# Patient Record
Sex: Male | Born: 1985 | Race: White | Hispanic: No | Marital: Single | Smoking: Current every day smoker
Health system: Southern US, Community
[De-identification: ages and names within clinical notes are randomized; demographics above are authoritative.]

## PROBLEM LIST (undated history)

## (undated) DIAGNOSIS — R768 Other specified abnormal immunological findings in serum: Secondary | ICD-10-CM

## (undated) DIAGNOSIS — F1199 Opioid use, unspecified with unspecified opioid-induced disorder: Secondary | ICD-10-CM

## (undated) HISTORY — DX: Opioid use, unspecified with unspecified opioid-induced disorder: F11.99

## (undated) HISTORY — DX: Other specified abnormal immunological findings in serum: R76.8

---
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
## 2007-03-01 ENCOUNTER — Emergency Department (HOSPITAL_COMMUNITY): Admission: EM | Admit: 2007-03-01 | Discharge: 2007-03-01 | Payer: Self-pay | Admitting: Emergency Medicine

## 2007-07-28 ENCOUNTER — Emergency Department (HOSPITAL_COMMUNITY): Admission: EM | Admit: 2007-07-28 | Discharge: 2007-07-28 | Payer: Self-pay | Admitting: Emergency Medicine

## 2007-08-05 ENCOUNTER — Emergency Department (HOSPITAL_COMMUNITY): Admission: EM | Admit: 2007-08-05 | Discharge: 2007-08-05 | Payer: Self-pay | Admitting: Emergency Medicine

## 2008-12-17 ENCOUNTER — Emergency Department (HOSPITAL_COMMUNITY): Admission: EM | Admit: 2008-12-17 | Discharge: 2008-12-17 | Payer: Self-pay | Admitting: Emergency Medicine

## 2009-02-24 ENCOUNTER — Emergency Department (HOSPITAL_COMMUNITY): Admission: EM | Admit: 2009-02-24 | Discharge: 2009-02-24 | Payer: Self-pay | Admitting: Emergency Medicine

## 2010-01-24 ENCOUNTER — Emergency Department (HOSPITAL_COMMUNITY)
Admission: EM | Admit: 2010-01-24 | Discharge: 2010-01-24 | Payer: Self-pay | Source: Home / Self Care | Admitting: Emergency Medicine

## 2010-01-27 ENCOUNTER — Emergency Department (HOSPITAL_COMMUNITY)
Admission: EM | Admit: 2010-01-27 | Discharge: 2010-01-28 | Payer: Self-pay | Source: Home / Self Care | Admitting: Emergency Medicine

## 2010-05-06 LAB — BASIC METABOLIC PANEL
BUN: 16 mg/dL (ref 6–23)
CO2: 24 mEq/L (ref 19–32)
Calcium: 8.7 mg/dL (ref 8.4–10.5)
Chloride: 103 mEq/L (ref 96–112)
Creatinine, Ser: 1.22 mg/dL (ref 0.4–1.5)
GFR calc Af Amer: >60 mL/min (ref >60)
GFR calc non Af Amer: >60 mL/min (ref >60)
Glucose, Bld: 129 mg/dL — ABNORMAL HIGH (ref 70–99)
Potassium: 3.1 mEq/L — ABNORMAL LOW (ref 3.5–5.1)
Sodium: 138 mEq/L (ref 135–145)

## 2010-05-06 LAB — CBC
HCT: 38.7 % — ABNORMAL LOW (ref 39.0–52.0)
Hemoglobin: 13.5 g/dL (ref 13.0–17.0)
MCHC: 34.9 g/dL (ref 30.0–36.0)
MCV: 92.2 fL (ref 78.0–100.0)
Platelets: 240 10*3/uL (ref 150–400)
RBC: 4.2 MIL/uL — ABNORMAL LOW (ref 4.22–5.81)
RDW: 12.7 % (ref 11.5–15.5)
WBC: 11.8 10*3/uL — ABNORMAL HIGH (ref 4.0–10.5)

## 2010-05-06 LAB — PROTIME-INR
INR: 0.9 (ref 0.00–1.49)
Prothrombin Time: 12.1 seconds (ref 11.6–15.2)

## 2010-06-21 ENCOUNTER — Emergency Department (HOSPITAL_COMMUNITY): Payer: Self-pay

## 2010-06-21 ENCOUNTER — Emergency Department (HOSPITAL_COMMUNITY)
Admission: EM | Admit: 2010-06-21 | Discharge: 2010-06-21 | Disposition: A | Discharge status: Home / Self Care | Payer: Self-pay | Attending: Emergency Medicine | Admitting: Emergency Medicine

## 2010-06-21 DIAGNOSIS — S0083XA Contusion of other part of head, initial encounter: Secondary | ICD-10-CM | POA: Insufficient documentation

## 2010-06-21 DIAGNOSIS — R0989 Other specified symptoms and signs involving the circulatory and respiratory systems: Secondary | ICD-10-CM | POA: Insufficient documentation

## 2010-06-21 DIAGNOSIS — H1130 Conjunctival hemorrhage, unspecified eye: Secondary | ICD-10-CM | POA: Insufficient documentation

## 2010-06-21 DIAGNOSIS — R0609 Other forms of dyspnea: Secondary | ICD-10-CM | POA: Insufficient documentation

## 2010-06-21 DIAGNOSIS — IMO0002 Reserved for concepts with insufficient information to code with codable children: Secondary | ICD-10-CM | POA: Insufficient documentation

## 2010-06-21 DIAGNOSIS — S0003XA Contusion of scalp, initial encounter: Secondary | ICD-10-CM | POA: Insufficient documentation

## 2010-06-21 DIAGNOSIS — S0510XA Contusion of eyeball and orbital tissues, unspecified eye, initial encounter: Secondary | ICD-10-CM | POA: Insufficient documentation

## 2010-06-21 DIAGNOSIS — R0789 Other chest pain: Secondary | ICD-10-CM | POA: Insufficient documentation

## 2010-06-21 DIAGNOSIS — S298XXA Other specified injuries of thorax, initial encounter: Secondary | ICD-10-CM | POA: Insufficient documentation

## 2010-06-21 LAB — URINALYSIS, ROUTINE W REFLEX MICROSCOPIC
Bilirubin Urine: NEGATIVE
Glucose, UA: NEGATIVE mg/dL
Hgb urine dipstick: NEGATIVE
Ketones, ur: NEGATIVE mg/dL
Nitrite: NEGATIVE
Protein, ur: NEGATIVE mg/dL
Specific Gravity, Urine: 1.019 (ref 1.005–1.030)
Urobilinogen, UA: 0.2 mg/dL (ref 0.0–1.0)
pH: 6 (ref 5.0–8.0)
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
## 2010-07-10 ENCOUNTER — Emergency Department (HOSPITAL_COMMUNITY)
Admission: EM | Admit: 2010-07-10 | Discharge: 2010-07-10 | Disposition: A | Discharge status: Home / Self Care | Payer: Self-pay | Attending: Emergency Medicine | Admitting: Emergency Medicine

## 2010-07-10 ENCOUNTER — Emergency Department (HOSPITAL_COMMUNITY): Payer: Self-pay

## 2010-07-10 DIAGNOSIS — F29 Unspecified psychosis not due to a substance or known physiological condition: Secondary | ICD-10-CM | POA: Insufficient documentation

## 2010-07-10 DIAGNOSIS — R569 Unspecified convulsions: Secondary | ICD-10-CM | POA: Insufficient documentation

## 2010-07-10 LAB — COMPREHENSIVE METABOLIC PANEL
ALT: 24 U/L (ref 0–53)
AST: 32 U/L (ref 0–37)
Albumin: 4.8 g/dL (ref 3.5–5.2)
Alkaline Phosphatase: 91 U/L (ref 39–117)
BUN: 14 mg/dL (ref 6–23)
GFR calc Af Amer: >60 mL/min (ref >60)
GFR calc non Af Amer: >60 mL/min (ref >60)
Glucose, Bld: 132 mg/dL — ABNORMAL HIGH (ref 70–99)
Potassium: 3.8 mEq/L (ref 3.5–5.1)
Sodium: 136 mEq/L (ref 135–145)
Total Bilirubin: 0.3 mg/dL (ref 0.3–1.2)
Total Protein: 7.5 g/dL (ref 6.0–8.3)

## 2010-07-10 LAB — RAPID URINE DRUG SCREEN, HOSP PERFORMED
Amphetamines: NOT DETECTED
Barbiturates: NOT DETECTED
Benzodiazepines: POSITIVE — AB
Cocaine: NOT DETECTED

## 2010-07-10 LAB — URINE MICROSCOPIC-ADD ON

## 2010-07-10 LAB — CBC
HCT: 44.8 % (ref 39.0–52.0)
Hemoglobin: 14.9 g/dL (ref 13.0–17.0)
MCH: 30.4 pg (ref 26.0–34.0)
MCHC: 33.3 g/dL (ref 30.0–36.0)
MCV: 91.4 fL (ref 78.0–100.0)
WBC: 9.6 10*3/uL (ref 4.0–10.5)

## 2010-07-10 LAB — URINALYSIS, ROUTINE W REFLEX MICROSCOPIC
Bilirubin Urine: NEGATIVE
Glucose, UA: NEGATIVE mg/dL
Ketones, ur: NEGATIVE mg/dL
Leukocytes, UA: NEGATIVE
Protein, ur: 100 mg/dL — AB
Specific Gravity, Urine: 1.024 (ref 1.005–1.030)
Urobilinogen, UA: 0.2 mg/dL (ref 0.0–1.0)
pH: 5.5 (ref 5.0–8.0)

## 2010-07-10 LAB — DIFFERENTIAL
Basophils Relative: 0 % (ref 0–1)
Eosinophils Absolute: 0.2 10*3/uL (ref 0.0–0.7)
Lymphocytes Relative: 35 % (ref 12–46)
Monocytes Absolute: 0.4 10*3/uL (ref 0.1–1.0)
Monocytes Relative: 4 % (ref 3–12)
Neutro Abs: 5.6 10*3/uL (ref 1.7–7.7)
Neutrophils Relative %: 59 % (ref 43–77)

## 2010-07-10 LAB — ETHANOL: Alcohol, Ethyl (B): <11 mg/dL — ABNORMAL HIGH (ref 0–10)

## 2010-08-07 ENCOUNTER — Inpatient Hospital Stay (HOSPITAL_COMMUNITY)
Admission: EM | Admit: 2010-08-07 | Discharge: 2010-08-08 | DRG: 572 | Disposition: A | Discharge status: Home / Self Care | Payer: Self-pay | Attending: Orthopedic Surgery | Admitting: Orthopedic Surgery

## 2010-08-07 ENCOUNTER — Emergency Department (HOSPITAL_COMMUNITY): Payer: Self-pay

## 2010-08-07 DIAGNOSIS — Y93K9 Activity, other involving animal care: Secondary | ICD-10-CM

## 2010-08-07 DIAGNOSIS — Y998 Other external cause status: Secondary | ICD-10-CM

## 2010-08-07 DIAGNOSIS — W540XXA Bitten by dog, initial encounter: Secondary | ICD-10-CM | POA: Diagnosis present

## 2010-08-07 DIAGNOSIS — Y92009 Unspecified place in unspecified non-institutional (private) residence as the place of occurrence of the external cause: Secondary | ICD-10-CM

## 2010-08-07 DIAGNOSIS — F1210 Cannabis abuse, uncomplicated: Secondary | ICD-10-CM | POA: Diagnosis present

## 2010-08-07 DIAGNOSIS — S61509A Unspecified open wound of unspecified wrist, initial encounter: Principal | ICD-10-CM | POA: Diagnosis present

## 2010-08-07 DIAGNOSIS — F411 Generalized anxiety disorder: Secondary | ICD-10-CM | POA: Diagnosis present

## 2010-08-07 LAB — BASIC METABOLIC PANEL
BUN: 8 mg/dL (ref 6–23)
CO2: 24 mEq/L (ref 19–32)
Chloride: 108 mEq/L (ref 96–112)
Creatinine, Ser: 0.73 mg/dL (ref 0.50–1.35)
GFR calc Af Amer: >60 mL/min (ref >60)
GFR calc non Af Amer: >60 mL/min (ref >60)
Glucose, Bld: 103 mg/dL — ABNORMAL HIGH (ref 70–99)
Potassium: 3.7 mEq/L (ref 3.5–5.1)
Sodium: 141 mEq/L (ref 135–145)

## 2010-08-07 LAB — CBC
HCT: 39.5 % (ref 39.0–52.0)
Hemoglobin: 13.2 g/dL (ref 13.0–17.0)
MCHC: 33.4 g/dL (ref 30.0–36.0)
MCV: 91.4 fL (ref 78.0–100.0)
Platelets: 247 10*3/uL (ref 150–400)
RBC: 4.32 MIL/uL (ref 4.22–5.81)
RDW: 12.2 % (ref 11.5–15.5)
WBC: 16 10*3/uL — ABNORMAL HIGH (ref 4.0–10.5)

## 2010-08-16 NOTE — H&P (Signed)
Benjamin Hansen, Benjamin Hansen            ACCOUNT NO.:  [id number]  MEDICAL RECORD NO.:  [id number]  LOCATION:  1612                         FACILITY:  [facility]  PHYSICIAN:  Benjamin Hansen, M.D.DATE OF BIRTH:  31-Dec-1985  DATE OF ADMISSION:  08/07/2010 DATE OF DISCHARGE:                             HISTORY & PHYSICAL   History and Physical/Procedure Note  CHIEF COMPLAINT:  "My brother's dog bit me."  HISTORY OF THE PRESENT ILLNESS:  Mr.  Hansen is a pleasant 25 year old gentleman, who is right-hand dominant, presents to [facility] Emergency Room for evaluation of his right upper extremity predicament after sustaining multiple dog bite injuries to the right wrist volar and dorsally.  The patient states that his brothers dog and his dog came and involved in an fight over food earlier this morning.  He tried to separate them when his brother's dog bit the right wrist volar and dorsal regions.  He states that the dog was domesticated and that rabies vaccinations were up-to-date.  In addition, he states his tetanus shot has been given within the last 5 years.  He received 1 g of Ancef IM per the emergency room staff on visit.  He has been n.p.o. since last night. His injury happened approximately 6 a.m. this morning.  Currently, he is fairly comfortable in regards to the upper extremity.  He denies any gross numbness or tingling of thumb, index, middle, or ring and small fingers.  He denies any other injury to the left hand or other portions of his body.  PAST MEDICAL HISTORY:  Negative for chronic medical issues.  Anxiety and a prior history of a seizure disorder apparently with no treatment to date.  CURRENT MEDICATIONS:  None.  DRUG ALLERGIES:  None.  PAST SURGICAL HISTORY: 1. He has had a complex laceration repair/reattachment of his left     ear. 2. In addition, he has a left index finger distal tip revision     amputation from a prior dog bite 1-2 years ago.  FAMILY  MEDICAL HISTORY:  Significant for COPD, multiple sclerosis in his aunt.  In addition, his grandparents had a history of "emphysema."  SOCIAL HISTORY:  He is single.  He has no children.  His denies tobacco use.  He will rarely enjoy an alcoholic beverage.  Cannabis abuse.  REVIEW OF SYSTEMS:  Negative for any fevers, chills, malaise, visual disturbances, visual changes, headache, shortness of breath, cough, dyspnea, nausea, vomiting, abdominal pain, difficulties with bowel or bladder functioning, weakness of the extremities.  PHYSICAL EXAMINATION:  VITAL SIGNS:  Blood pressure is 127/72, pulse 88, respirations 18, temperature is 97.8, O2 sat is 97%. GENERAL:  He is pleasant, no acute distress, appearing his stated age in weight and height, accompanied with his mother. HEAD AND FACE:  Show that he has had a complex revision/reattachment procedure to the left ear, otherwise he is atraumatic, normocephalic. EYES:  Pupils are equal, round and reactive to light and accommodation per emergency room records. NECK:  Supple without masses. RESPIRATORY:  He has normal respiratory effort and excursion. CHEST:  Nontender. ABDOMEN:  Soft, nontender.  No guarding is present. EXTREMITIES:  Evaluation of the extremities shows that left upper extremity  has normal alignment, strength, and stability.  He has a small abrasion about the dorsal aspect of the left hand.  This is very superficial and does not penetrate deeply.  Evaluation of the right upper extremity shows that he has 3 linear lacerations about the volar wrist region beginning at the distal wrist crease.  These are approximately 3 cm or more in nature.  There is obvious exposed subcutaneous tissue present about each laceration.  There is obvious pre- necrotic tissue and nonviable tissue apparent.  In addition, he has three smaller lacerations about the dorsal ulnar aspect of his wrist overlying the ulnar styloid and slightly proximal.   These are deep in nature with subcutaneous tissue exposed.  I should note FPL and EPL functioning are intact.  FDS and FDP functioning intact about the index, middle, ring and small finger.  Extensor mechanism is intact and nontender.  He is intact both with flexion and extension with resistance.  Median nerve distribution intact.  His APB fires nicely. Deep branch of the ulnar nerve is intact.  Intrinsics fire nicely. First dorsal interosseus is intact.  Wrist flexion and extension are intact and he has excellent refill noted about each digit.  DIAGNOSTIC STUDIES:  Radiographs show no acute bony abnormalities, space- occupying lesions, soft air in the tissues were present.  ASSESSMENT: 1. Multiple dog bite injuries to the right wrist both dorsal and     volar, grossly neurovascularly intact. 2. Questionable history of a seizure disorder without current     treatment to date.  PLAN:  We discussed with Benjamin Hansen, the nature of his upper extremity predicament and our recommendation is to proceed with a preliminary incision and drainage and exploration of his wounds in the ER setting.  Thus, after obtaining verbal consent, the patient underwent implementation of a local field block about the dorsal and volar wounds using three 10 cc syringes filled with 5 cc of Marcaine without epinephrine and 5 cc of Xylocaine.  A local field block was instituted to the volar wrist region and the dorsal wrist region.  Once appropriate anesthetic was obtained, the patient was then sterilely prepped and draped at which time I should note 5 L of saline were then used to irrigate the wounds sequentially.  Once this was performed, exploration of the volar wounds revealed penetration to the subcutaneous tissue. The palmaris longus was identified; however, the sheath was intact and there did not appear to be penetration into the sheath.  Volar ulnar aspect of the wound was explored.  There did not appear  to be obvious laceration or involvement of the tendon sheath present.  Ulnar nerve was intact clinically, but not identified via exploration.  The dorsal wounds were then explored showing no obvious tendon involvement at that juncture.  I should note these wounds were deep in nature involving the skin, subcutaneous tissue, and portions of the flexor and extensor muscles.  Once exploration was performed, copious irrigation was re- implemented with excisional debridement of necrotic and pre- necrotic/nonviable skin edges and subcutaneous tissue.  Once appropriate incision and drainage was performed to the wounds, Penrose drains were placed into the wounds.  Following this, a loose closure with 1 chromic suture was placed about the most distal wound and the second most distal wound about the volar wrist.  Remaining wounds were left open to drain with packing and/or Penrose drain implementation.  Neosporin/Triple Antibiotic was then applied to the hand and arm as he had some small abrasions about  the digits without deep penetration.  He was then placed in bulky soft dressings followed by wrist splint.  We will plan to admit him for close observation, IV antibiotics to include Unasyn 3 g IV q.6 h.  We will keep a close watch on him over the next 24 hours.  We are going to keep him n.p.o. at this juncture and reevaluate him later this afternoon.  If he all looks well at that juncture, we will consider implementing a regular diet.  If he has any complicating features later this afternoon, formal exploration in the operative suite may be entertained.  All questions were encouraged and answered.     Karie Chimera, P.A.-C.   ______________________________ Benjamin Ano. Amanda Pea, M.D.    BB/MEDQ  D:  08/07/2010  T:  08/07/2010  Job:  [id number]  Electronically Signed by Karie Chimera P.A.-C. on 08/09/2010 07:19:09 AM Electronically Signed by Dominica Severin M.D. on 08/16/2010 07:25:53 PM

## 2010-10-08 NOTE — Discharge Summary (Signed)
Benjamin Hansen, Benjamin Hansen            ACCOUNT NO.:  [id number]  MEDICAL RECORD NO.:  [id number]  LOCATION:  1612                         FACILITY:  [facility]  PHYSICIAN:  Dionne Ano. Viola Placeres, M.D.DATE OF BIRTH:  December 10, 1985  DATE OF ADMISSION:  08/07/2010 DATE OF DISCHARGE:  08/08/2010                              DISCHARGE SUMMARY   ADMITTING DIAGNOSES: 1. Multiple dog bite injury to the right wrist to include the dorsal     and volar aspect. 2. Questionable history of seizure disorder without current treatment     today.  DISCHARGE DIAGNOSES: 1. Multiple dog bite injury to the right wrist to include the dorsal     and volar aspect, improved. 2. Questionable history of seizure disorder without current treatment     today.  SURGEON:  Dionne Ano. Amanda Pea, MD  CONSULTS:  None.  PROCEDURE:  Irrigation and excisional debridement of the right wrist dorsal and volar aspects without gross neurovascular compromise with mild contusive injury to the median nerve.  BRIEF HISTORY OF PRESENT ILLNESS:  Benjamin Hansen is a pleasant 25 year old gentleman who presented to [facility] Room for evaluation of his upper extremity after sustaining multiple dog bites to the right wrist and forearm region.  He was noted to have multiple lacerations about the dorsal and volar forearm.  He underwent I and D in the emergency room setting under local field block and was admitted for IV antibiotics and close observation to ensure he did not develop an infectious process.  He tolerated the procedure in the emergency room setting without difficulties and was admitted for IV antibiotics, pain medication, and close observation.  His WBC at time of admit was noted be 16, H and H was 13.2 and 39.5.  BMET was within normal limits.  His vital signs were stable.  He was afebrile.  HOSPITAL COURSE:  Mr. Benjamin Hansen was admitted on July 19th after undergoing I and D in the emergency room setting secondary to  multiple dog bites.  He was placed on Unasyn for antibiotic prophylaxis. Diligent wound care was implemented at the time of his admit with daily wound care to consist wet-to-dry dressings and whirlpool.  He tolerated this very well.  He remained stable without increased amount of difficulty.  His pain was controlled.  He denied any fevers, chills, or constitutional symptoms.  On the following day, he had mild numbness about the thumb consistent with median nerve contusive injury but was grossly neurovascular intact with excellent digital range of motion. His wounds after 24 hours admit showed no increased signs of infectious process or cellulitis.  No purulent drainage was present.  His vital signs were stable.  He was afebrile.  Decision made to discharge him home to care of his family on August 08, 2010.  Head was atraumatic. Lungs were clear to auscultation.  Heart is regular rate and rhythm. Abdomen was nontender.  Bowel sounds were positive.  He was neurovascularly intact.  Decision was made to discharge him home with close follow up in our office as well as antibiotics daily.  FINAL DIAGNOSES: 1. Status post incision and drainage multiple dog bite wounds to the     right upper  extremity. 2. History of questionable seizure disorder with no treatment to date     and no obvious seizure activity noted while inpatient.  PLAN:  We are going to discharge him home to the care of his family.  He will be on a regular diet.  We discussed with him elevation, diligent finger range of motion.  We will have return to see [place] in our office tomorrow at 7:00 a.m. for wound check.  We will once again reiterate to him wet-to-dry dressing changes and wound care.  DISCHARGE MEDICATIONS: 1. Augmentin 875 one p.o. b.i.d. for 14 days. 2. Percocet 1-2 p.o. q.4-6 h p.r.n. pain. 3. Vitamin C 1000 mg. 4. Peri-Colace to prevent constipation.     Karie Chimera,  P.A.-C.   ______________________________ Dionne Ano. Amanda Pea, M.D.    BB/MEDQ  D:  09/07/2010  T:  09/08/2010  Job:  [id number]  Electronically Signed by Karie Chimera P.A.-C. on 09/10/2010 10:59:53 AM Electronically Signed by Dominica Severin M.D. on 10/08/2010 06:27:20 AM

## 2010-11-15 LAB — CBC
HCT: 42.4
Platelets: 276
RDW: 12.4
WBC: 7.4

## 2010-11-15 LAB — POCT I-STAT, CHEM 8
BUN: 11
Calcium, Ion: 1.14
Chloride: 106
HCT: 45
Sodium: 140

## 2014-09-18 ENCOUNTER — Emergency Department (HOSPITAL_COMMUNITY)
Admission: EM | Admit: 2014-09-18 | Discharge: 2014-09-19 | Disposition: A | Discharge status: Home / Self Care | Payer: Self-pay | Attending: Emergency Medicine | Admitting: Emergency Medicine

## 2014-09-18 ENCOUNTER — Encounter (HOSPITAL_COMMUNITY): Payer: Self-pay | Admitting: Emergency Medicine

## 2014-09-18 DIAGNOSIS — Y999 Unspecified external cause status: Secondary | ICD-10-CM | POA: Insufficient documentation

## 2014-09-18 DIAGNOSIS — Y939 Activity, unspecified: Secondary | ICD-10-CM | POA: Insufficient documentation

## 2014-09-18 DIAGNOSIS — T40601A Poisoning by unspecified narcotics, accidental (unintentional), initial encounter: Secondary | ICD-10-CM | POA: Insufficient documentation

## 2014-09-18 DIAGNOSIS — Z720 Tobacco use: Secondary | ICD-10-CM | POA: Insufficient documentation

## 2014-09-18 DIAGNOSIS — Y929 Unspecified place or not applicable: Secondary | ICD-10-CM | POA: Insufficient documentation

## 2014-09-18 NOTE — ED Notes (Signed)
Pt states he used tonight for the first time in 4 months.  Family states they found his cyanotic and agonal breathing.  Family states that they did try CPR.  Pt was A&O at arrival of EMS.

## 2014-09-18 NOTE — ED Provider Notes (Signed)
CSN: [id number]     Arrival date & time 09/18/14  2247 History   First MD Initiated Contact with Patient 09/18/14 2318     Chief Complaint  Patient presents with  . Drug Overdose     (Consider location/radiation/quality/duration/timing/severity/associated sxs/prior Treatment) Patient is a 29 y.o. male presenting with Overdose. The history is provided by the patient.  Drug Overdose  He injected heroin at about 10 PM and apparently collapsed. Family is reported to a found him cyanotic and with agonal breathing and dad did start CPR. EMS reports that he was awake and alert on their arrival. He states that he has a history of narcotic abuse but had been clean for several years until about 3 months ago. He denies using any other drugs.  History reviewed. No pertinent past medical history. History reviewed. No pertinent past surgical history. History reviewed. No pertinent family history. History  Substance Use Topics  . Smoking status: Current Every Day Smoker -- 0.50 packs/day    Types: Cigarettes  . Smokeless tobacco: Not on file  . Alcohol Use: No    Review of Systems  All other systems reviewed and are negative.     Allergies  Review of patient's allergies indicates no known allergies.  Home Medications   Prior to Admission medications   Medication Sig Start Date End Date Taking? Authorizing Provider  naproxen sodium (ANAPROX) 220 MG tablet Take 220 mg by mouth 2 (two) times daily as needed (pain).   Yes Historical Provider, MD   BP 125/83 mmHg  Pulse 112  Temp(Src) 98.4 F (36.9 C) (Oral)  Resp 12  SpO2 94% Physical Exam  Nursing note and vitals reviewed.  29 year old male, resting comfortably and in no acute distress. Vital signs are significant for tachycardia. Oxygen saturation is 94%, which is normal. Head is normocephalic and atraumatic. PERRLA, EOMI. Oropharynx is clear. Neck is nontender and supple without adenopathy or JVD. Back is nontender and there is  no CVA tenderness. Lungs are clear without rales, wheezes, or rhonchi. Chest is nontender. Heart has regular rate and rhythm without murmur. Abdomen is soft, flat, nontender without masses or hepatosplenomegaly and peristalsis is normoactive. Extremities have no cyanosis or edema, full range of motion is present. Recent injection site noted in right antecubital space. Skin is warm and dry without rash. Neurologic: Mental status is normal, cranial nerves are intact, there are no motor or sensory deficits.  ED Course  Procedures (including critical care time)  MDM   Final diagnoses:  Opiate overdose, accidental or unintentional, initial encounter    Narcotic overdose-probably accommodation of heroin and fentanyl. He is awake and alert currently. No indication for any lab testing. He will be observed in the ED until 4 hours after his injection.  He continued to be awake, alert, with no signs of respiratory depression. He is discharged with a prescription for naloxone autoinjector. Resource guide given some that he can connect with appropriate drug counseling programs.  Dione Booze, MD 09/19/14 0130

## 2014-09-18 NOTE — ED Notes (Signed)
Bed: WA04 Expected date: 09/18/14 Expected time: 10:31 PM Means of arrival: Ambulance Comments: Heroin overdose

## 2014-09-18 NOTE — ED Notes (Signed)
Report given to Travia, RN 

## 2014-09-18 NOTE — ED Notes (Signed)
Dr. Glick at bedside.  

## 2014-09-19 MED ORDER — NALOXONE HCL 0.4 MG/ML IJ SOLN: 0.4000 mg | INTRAMUSCULAR | Status: DC | PRN

## 2014-09-19 NOTE — Discharge Instructions (Signed)
Do not inject any narcotics - ever!   Narcotic Overdose A narcotic overdose is the misuse or overuse of a narcotic drug. A narcotic overdose can make you pass out and stop breathing. If you are not treated right away, this can cause permanent brain damage or stop your heart. Medicine may be given to reverse the effects of an overdose. If so, this medicine may bring on withdrawal symptoms. The symptoms may be abdominal cramps, throwing up (vomiting), sweating, chills, and nervousness. Injecting narcotics can cause more problems than just an overdose. AIDS, hepatitis, and other very serious infections are transmitted by sharing needles and syringes. If you decide to quit using, there are medicines which can help you through the withdrawal period. Trying to quit all at once on your own can be uncomfortable, but not life-threatening. Call your caregiver, Narcotics Anonymous, or any drug and alcohol treatment program for further help.  Document Released: 03/14/2004 Document Revised: 04/29/2011 Document Reviewed: 01/06/2009 [facility] Patient Information 2015 [place], [place]. This information is not intended to replace advice given to you by your health care provider. Make sure you discuss any questions you have with your health care provider.  Naloxone injection What is this medicine? NALOXONE (nal OX one) is a narcotic blocker. It is used to treat narcotic drug overdose. This medicine may be used for other purposes; ask your health care provider or pharmacist if you have questions. COMMON BRAND NAME(S): EVZIO, Narcan What should I tell my health care provider before I take this medicine? They need to know if you have any of these conditions: -drug abuse or addiction -heart disease -an unusual or allergic reaction to naloxone, other medicines, foods, dyes, or preservatives -pregnant or trying to get pregnant -breast-feeding How should I use this medicine? This medicine is for injection into the outer  thigh. It can be injected through clothing if needed. Get emergency medical help right away after giving the first dose of this medicine, even if the person wakes up. You should be familiar with how to recognize the signs and symptoms of an opioid overdose. Administer according to the printed instructions on the device label or the electronic voice instructions. You should practice using the Trainer injector before this medicine is needed. Talk to your pediatrician regarding the use of this medicine in children. While this drug may be prescribed for children as young as newborn for selected conditions, precautions do apply. For infants less than 1 year of age, pinch the thigh muscle while administering. Overdosage: If you think you have taken too much of this medicine contact a poison control center or emergency room at once. NOTE: This medicine is only for you. Do not share this medicine with others. What if I miss a dose? This does not apply. What may interact with this medicine? This medicine is only used during an emergency. No interactions are expected during emergency use. This list may not describe all possible interactions. Give your health care provider a list of all the medicines, herbs, non-prescription drugs, or dietary supplements you use. Also tell them if you smoke, drink alcohol, or use illegal drugs. Some items may interact with your medicine. What should I watch for while using this medicine? Keep this medicine ready for use in the case of an opioid overdose. Make sure that you have the phone number of your doctor or health care professional and local hospital ready. You may need to have additional doses of this medicine. Each injector contains a single dose. Some emergencies  may require additional doses. After use, bring the treated person to the nearest hospital or call 911. Make sure the treating health care professional knows that the person has received an injection of this  medicine. You will receive additional instructions on what to do during and after use of this medicine before an emergency occurs. What side effects may I notice from receiving this medicine? Side effects that you should report to your doctor or health care professional as soon as possible: -allergic reactions like skin rash, itching or hives, swelling of the face, lips, or tongue -breathing problems -fast, irregular heartbeat -high blood pressure -pain that was controlled by narcotic pain medicine -seizures -stomach cramps Side effects that usually do not require medical attention (report to your doctor or health care professional if they continue or are bothersome): -aches and pains -diarrhea -fever or chills -irritable, nervous, restless -nausea, vomiting -runny nose -sweating -trembling -weak This list may not describe all possible side effects. Call your doctor for medical advice about side effects. You may report side effects to FDA at 1-800-FDA-1088. Where should I keep my medicine? Keep out of the reach of children. Store at room temperature between 15 and 25 degrees C (59 and 77 degrees F). Keep this medicine in its outer case until ready to use. Occasionally check the solution through the viewing window of the injector. The solution should be clear. If it is discolored, cloudy, or contains solid particles, replace it with a new injector. Remember to check the expiration date of this medicine regularly. Throw away any unused medicine after the expiration date. NOTE: This sheet is a summary. It may not cover all possible information. If you have questions about this medicine, talk to your doctor, pharmacist, or health care provider.  2015, Elsevier/Gold Standard. (2012-06-03 13:44:54)   Emergency Department Resource Guide 1) Find a Doctor and Pay Out of Pocket Although you won't have to find out who is covered by your insurance plan, it is a good idea to ask around and get  recommendations. You will then need to call the office and see if the doctor you have chosen will accept you as a new patient and what types of options they offer for patients who are self-pay. Some doctors offer discounts or will set up payment plans for their patients who do not have insurance, but you will need to ask so you aren't surprised when you get to your appointment.  2) Contact Your Local Health Department Not all health departments have doctors that can see patients for sick visits, but many do, so it is worth a call to see if yours does. If you don't know where your local health department is, you can check in your phone book. The CDC also has a tool to help you locate your state's health department, and many state websites also have listings of all of their local health departments.  3) Find a Walk-in Clinic If your illness is not likely to be very severe or complicated, you may want to try a walk in clinic. These are popping up all over the country in pharmacies, drugstores, and shopping centers. They're usually staffed by nurse practitioners or physician assistants that have been trained to treat common illnesses and complaints. They're usually fairly quick and inexpensive. However, if you have serious medical issues or chronic medical problems, these are probably not your best option.  No Primary Care Doctor: - Call Health Connect at  [phone] - they can help you locate a  primary care doctor that  accepts your insurance, provides certain services, etc. - Physician Referral Service- [phone]  Chronic Pain Problems: Organization         Address  Phone   Notes  [facility]  [phone] Patients need to be referred by their primary care doctor.   Medication Assistance: Organization         Address  Phone   Notes  [facility] Medication [facility] [address] [place] [place], [place] [address] [phone] --Must be a resident of  [facility] -- Must have NO insurance coverage whatsoever (no Medicaid/ Medicare, etc.) -- The pt. MUST have a primary care doctor that directs their care regularly and follows them in the community   [facility]ist  [phone]   [employer]  [phone]    Agencies that provide inexpensive medical care: Organization         Address  Phone   Notes  [facility]  [phone]   [facility]    [phone]   [facility] [address] [place], [place] [address] [phone]   [facility] [address] [place] [place] [phone]   Planned Parenthood    [phone]   [facility]    [phone]   [facility]  [address], [place] Phone:  [phone], Fax:  [phone] Hours of Operation:  9 am - 6 pm, M-F.  Also accepts Medicaid/Medicare and self-pay.  [facility]  [address], [place] Phone: [phone], Fax: [phone]. Hours of Operation:  8:30 am - 5:30 pm, M-F.  Also accepts Medicaid and self-pay.  [facility] [place] [address], [place] Phone: [phone]   [facility] [address] Natasha Bence [place], [place] [phone], Ext. [phone] Mondays & Thursdays: 7-9 AM.  First 15 patients are seen on a first come, first serve basis.    Medicaid-accepting [facility] Providers:  Organization         Address  Phone   Notes  [facility] [address], [place] [phone] Also accepts self-pay patients.  [facility] [address] Laurell Josephs [place], [place]  [phone]   [facility] [address], [place] [phone]   [facility] [address] [place] [phone]   Renaye Rakers [address], [place]   [phone] Only accepts [place] Access  [place] patients after they have their name applied to their card.   Self-Pay (no insurance) in [facility]:  Organization         Address  Phone   Notes  Sickle Cell Patients, [facility] [address] [place], [place] [phone]   [facility] [address] [place], [place] [phone]   [facility] [place]  [address], [place] [phone]   [facility]/Dr. Osei-Bonsu  [address], [place] or [address], [place] [phone] Phone number for both [place] and [place] locations is the same.  [facility] [address] [place] [phone]   [facility] [address] [place] [address] [place] or [address] [phone]  [phone]   [facility] [address] [place] [phone], phone; [phone], fax Sees patients 1st and 3rd Saturday of every month.  Must not qualify for public or private insurance (i.e. Medicaid, Medicare, [place] Health Choice, Veterans' Benefits)  Household income should be no more than 200% of the poverty level The clinic cannot treat you if you are pregnant or think you are pregnant  Sexually transmitted diseases are not treated at the clinic.    Dental Care: Organization         Address  Phone  Notes  [facility] Department of [facility] [address] [place], [place] [phone] Accepts children up to age 38 who are enrolled in [place] or [place] Health Choice; pregnant women with a Medicaid card; and children who have applied for Medicaid or [place] Health Choice, but were declined, whose parents can pay a reduced fee at time of service.  [facility] of [facility]  [address], [place] [phone] Accepts children up to age 83 who are enrolled in [place] or [place] Health Choice; pregnant women with a Medicaid  card; and children who have applied for Medicaid or [place] Health Choice, but were declined, whose parents can pay a reduced fee at time of service.  [facility]  [address] [place], [place] [phone] Patients are seen by appointment only. Walk-ins are not accepted. [facility] will see patients 98 years of age and older. Monday - Tuesday (8am-5pm) Most Wednesdays (8:30-5pm) $30 per visit, cash only  [facility] Adult [facility] Access PRO[facility]  [address], [facility] [phone] Patients are seen by appointment only. Walk-ins are not accepted. [facility] will see patients 64 years of age and older. One Wednesday Evening (Monthly: Volunteer Based).  $30 per visit, cash only  [employer]  [phone] for adults; Children under age 62, call Graduate Pediatric Dentistry at [phone]. Children aged 51-14, please call [phone] to request a pediatric application.  Dental services are provided in all areas of dental care including fillings, crowns and bridges, complete and partial dentures, implants, gum treatment, root canals, and extractions. Preventive care is also provided. Treatment is provided to both adults and children. Patients are selected via a lottery and there is often a waiting list.   [facility] [address] [place]  [phone] [email or web address]   [facility] [address] [place], [place] [phone], Ext. [phone] Second and Fourth Thursday of each month, opens at 6:30 AM; Clinic ends at 9 AM.  Patients are seen on a first-come first-served basis, and a limited number are seen during each clinic.   [facility]  [address] Ether Griffins [place], [place] [phone]   Eligibility Requirements You must have lived in [place], [place], or [place] counties for at least the last three months.   You cannot be eligible for state or federal sponsored [place],  including [employer], [place], or [employer].   You generally cannot be eligible for healthcare insurance through your employer.    How to apply: Eligibility screenings are held every Tuesday and Wednesday afternoon from 1:00 pm until 4:00 pm. You do not need an appointment for the interview!  [facility] [address], [place], [place] [phone]   [facility] Health Department  [phone]   [facility] Health Department  [phone]  [place] in the Community: Intensive Outpatient Programs Organization         Address  Phone  Notes  [place]. [address] [place], [place] [phone]   [facility] Outpatient [address] [place], [place]   ADS: [facility] Drug [facility] [address], [place], [place]   [place] [address],  [place], [place] or [phone]   Substance Abuse Resources Organization         Address  Phone  Notes  Alcohol and Drug Services  [phone]   [place]  [phone]   [facility] [place]   Chinita Pester  [phone]   Residential & Outpatient Substance Abuse Program  [phone]   Psychological Services Organization         Address  Phone  Notes  [facility] [place]  [place]  [phone]   [place] [address], [place] or [phone]    Mobile Crisis Teams Organization         Address  Phone  Notes  [facility], Mobile Crisis Care Unit  [phone]   [facility]  [address] [place], [place]   [place] Levels [address] [place] [phone]    Self-Help/Support Groups Organization         Address  Phone             Notes  [place].  of [place] - variety of support groups  [place] Call for more information  Narcotics Anonymous (NA), [facility] [address], [employer] [place]  2 meetings at this location   Special educational needs teacher         Address  Phone  Notes  [facility] [place],    [place]  [phone]   [facility]  [address] [place] [phone], [place], [place]   Carlos Latham, [place] [phone] Admissions: 8am-3pm M-F  Incentives Substance [place] [address],    [place], [place] [phone]   [facility] [address] [place], [place], [place]   [facility] [address],  [place], [place]   [facility] - Intensive Outpatient [address] Kristeen [address], [place], [place]   [facility].,  [place], [place] [phone] or [phone]   [facility] ([facility]) [address], [place], [place] Accepts Medicaid  [facility] [address]  [place] [phone] Substance Abuse/Addiction Treatment   [facility] Organization         Address  Phone  Notes  [facility]  [phone]   Domenic Schwab, PhD [address] Arlis [place], [place]   [phone] or [phone]   [place] [place], [place] [phone]   [place] [address] [place], [place] [phone] Insurance/Medicaid/sponsorship through [employer] and Families [address], [place]                                    [place], [place] [phone] Therapy/tele-psych/case  [facility] [place], [place] [phone]    Dr. Adele Schilder  [phone]   [facility] Dept. 1) [address] [place] 2)  [place] 3)  [place] [address], [place] [phone]  [phone]   [place] [phone] or [phone] (After Hours)

## 2014-09-19 NOTE — ED Notes (Signed)
Family at bedside. (father)

## 2014-10-02 ENCOUNTER — Encounter (HOSPITAL_COMMUNITY): Payer: Self-pay | Admitting: Emergency Medicine

## 2014-10-02 ENCOUNTER — Emergency Department (HOSPITAL_COMMUNITY)
Admission: EM | Admit: 2014-10-02 | Discharge: 2014-10-02 | Disposition: A | Discharge status: Home / Self Care | Payer: Self-pay | Attending: Emergency Medicine | Admitting: Emergency Medicine

## 2014-10-02 DIAGNOSIS — Z720 Tobacco use: Secondary | ICD-10-CM | POA: Insufficient documentation

## 2014-10-02 DIAGNOSIS — Y998 Other external cause status: Secondary | ICD-10-CM | POA: Insufficient documentation

## 2014-10-02 DIAGNOSIS — Y9389 Activity, other specified: Secondary | ICD-10-CM | POA: Insufficient documentation

## 2014-10-02 DIAGNOSIS — F1210 Cannabis abuse, uncomplicated: Secondary | ICD-10-CM | POA: Insufficient documentation

## 2014-10-02 DIAGNOSIS — T401X1A Poisoning by heroin, accidental (unintentional), initial encounter: Secondary | ICD-10-CM | POA: Insufficient documentation

## 2014-10-02 DIAGNOSIS — F1110 Opioid abuse, uncomplicated: Secondary | ICD-10-CM | POA: Insufficient documentation

## 2014-10-02 DIAGNOSIS — F1410 Cocaine abuse, uncomplicated: Secondary | ICD-10-CM | POA: Insufficient documentation

## 2014-10-02 DIAGNOSIS — Y9289 Other specified places as the place of occurrence of the external cause: Secondary | ICD-10-CM | POA: Insufficient documentation

## 2014-10-02 DIAGNOSIS — T50901A Poisoning by unspecified drugs, medicaments and biological substances, accidental (unintentional), initial encounter: Secondary | ICD-10-CM

## 2014-10-02 LAB — CBC
HEMATOCRIT: 42.5 % (ref 39.0–52.0)
HEMOGLOBIN: 13.9 g/dL (ref 13.0–17.0)
MCH: 30 pg (ref 26.0–34.0)
MCHC: 32.7 g/dL (ref 30.0–36.0)
MCV: 91.8 fL (ref 78.0–100.0)
PLATELETS: 270 10*3/uL (ref 150–400)
RBC: 4.63 MIL/uL (ref 4.22–5.81)
RDW: 13.4 % (ref 11.5–15.5)
WBC: 22.7 10*3/uL — ABNORMAL HIGH (ref 4.0–10.5)

## 2014-10-02 LAB — COMPREHENSIVE METABOLIC PANEL
ALT: 163 U/L — ABNORMAL HIGH (ref 17–63)
AST: 196 U/L — ABNORMAL HIGH (ref 15–41)
Albumin: 4 g/dL (ref 3.5–5.0)
Alkaline Phosphatase: 104 U/L (ref 38–126)
Anion gap: 8 (ref 5–15)
BILIRUBIN TOTAL: 0.4 mg/dL (ref 0.3–1.2)
BUN: 21 mg/dL — AB (ref 6–20)
CALCIUM: 8.7 mg/dL — AB (ref 8.9–10.3)
CO2: 23 mmol/L (ref 22–32)
CREATININE: 1.49 mg/dL — AB (ref 0.61–1.24)
Chloride: 109 mmol/L (ref 101–111)
GFR calc Af Amer: >60 mL/min (ref >60)
GFR calc non Af Amer: >60 mL/min (ref >60)
Glucose, Bld: 296 mg/dL — ABNORMAL HIGH (ref 65–99)
Potassium: 4.1 mmol/L (ref 3.5–5.1)
SODIUM: 140 mmol/L (ref 135–145)
Total Protein: 7.4 g/dL (ref 6.5–8.1)

## 2014-10-02 LAB — RAPID URINE DRUG SCREEN, HOSP PERFORMED
Amphetamines: NOT DETECTED
BARBITURATES: NOT DETECTED
Benzodiazepines: NOT DETECTED
COCAINE: POSITIVE — AB
Opiates: POSITIVE — AB
Tetrahydrocannabinol: POSITIVE — AB

## 2014-10-02 MED ORDER — SODIUM CHLORIDE 0.9 % IV BOLUS (SEPSIS)
1000.0000 mL | Freq: Once | INTRAVENOUS | Status: AC
  Administered 2014-10-02: 1000 mL via INTRAVENOUS

## 2014-10-02 NOTE — ED Notes (Signed)
Pt's father at bedside, pt unwilling to speak with father. Pt states he does not want staff to discuss condition/cause with father. Father notified of pts wishes, father left.

## 2014-10-02 NOTE — ED Provider Notes (Signed)
CSN: [id number]     Arrival date & time 10/02/14  1756 History   First MD Initiated Contact with Patient 10/02/14 1814     Chief Complaint  Patient presents with  . Drug Overdose     (Consider location/radiation/quality/duration/timing/severity/associated sxs/prior Treatment) HPI Benjamin Hansen is a 29 y.o. male with a history of substance abuse, comes in for evaluation of drug overdose. Patient states approximately 5 or 6:00 PM, he admits to using $20 worth of heroin. He reports after injection, he got in his car and started driving down the road, "felt weird and pulled over, the next thing I know I'm waking up in the ambulance". Per EMS, patient received a total 4 mg of narcan- 2 IN, 2 IM. Patient is alert and oriented now, states he is thirsty. Denies homicidal or suicidal ideation. Denies any chest pain, shortness of breath. No other aggravating or modifying factors.  History reviewed. No pertinent past medical history. History reviewed. No pertinent past surgical history. No family history on file. Social History  Substance Use Topics  . Smoking status: Current Every Day Smoker -- 0.50 packs/day    Types: Cigarettes  . Smokeless tobacco: None  . Alcohol Use: No    Review of Systems A 10 point review of systems was completed and was negative except for pertinent positives and negatives as mentioned in the history of present illness     Allergies  Review of patient's allergies indicates no known allergies.  Home Medications   Prior to Admission medications   Medication Sig Start Date End Date Taking? Authorizing Provider  naloxone [facility]) 0.4 MG/ML injection Inject 1 mL (0.4 mg total) into the muscle as needed. Naloxone Autoinjector Patient taking differently: Inject 0.4 mg into the muscle as needed (for OD). Naloxone Autoinjector 09/19/14  Yes Dione Booze, MD  naproxen sodium (ANAPROX) 220 MG tablet Take 220 mg by mouth 2 (two) times daily as needed (pain).   Yes Historical  Provider, MD   BP 108/53 mmHg  Pulse 88  Temp(Src) 98.5 F (36.9 C) (Oral)  Resp 15  SpO2 99% Physical Exam  Constitutional: He is oriented to person, place, and time. He appears well-developed and well-nourished.  HENT:  Head: Normocephalic and atraumatic.  Mouth/Throat: Oropharynx is clear and moist.  Eyes: Conjunctivae are normal. Pupils are equal, round, and reactive to light. Right eye exhibits no discharge. Left eye exhibits no discharge. No scleral icterus.  Neck: Neck supple.  Cardiovascular: Normal rate, regular rhythm and normal heart sounds.   Pulmonary/Chest: Effort normal and breath sounds normal. No respiratory distress. He has no wheezes. He has no rales.  Abdominal: Soft. There is no tenderness.  Musculoskeletal: He exhibits no tenderness.  Neurological: He is alert and oriented to person, place, and time.  Cranial Nerves II-XII grossly intact  Skin: Skin is warm and dry. No rash noted.  Psychiatric: He has a normal mood and affect.  Nursing note and vitals reviewed.   ED Course  Procedures (including critical care time) Labs Review Labs Reviewed  COMPREHENSIVE METABOLIC PANEL - Abnormal; Notable for the following:    Glucose, Bld 296 (*)    BUN 21 (*)    Creatinine, Ser 1.49 (*)    Calcium 8.7 (*)    AST 196 (*)    ALT 163 (*)    All other components within normal limits  CBC - Abnormal; Notable for the following:    WBC 22.7 (*)    All other components within normal limits  URINE RAPID DRUG SCREEN, HOSP PERFORMED - Abnormal; Notable for the following:    Opiates POSITIVE (*)    Cocaine POSITIVE (*)    Tetrahydrocannabinol POSITIVE (*)    All other components within normal limits    Imaging Review No results found. I, Sharlene Motts, personally reviewed and evaluated these images and lab results as part of my medical decision-making.   EKG Interpretation   Date/Time:  Sunday October 02 2014 18:05:49 EDT Ventricular Rate:  99 PR Interval:   171 QRS Duration: 87 QT Interval:  343 QTC Calculation: 440 R Axis:   54 Text Interpretation:  Sinus rhythm RSR' in V1 or V2, right VCD or RVH  Probable left ventricular hypertrophy Confirmed by COOK  MD, BRIAN ([id number])  on 10/02/2014 6:15:11 PM     Filed Vitals:   10/02/14 1802 10/02/14 1806 10/02/14 1853 10/02/14 1926  BP:  98/55 112/67 108/53  Pulse:  99 101 88  Temp:  98.5 F (36.9 C)    TempSrc:  Oral    Resp:  12 17 15   SpO2: 96% 93% 97% 99%    MDM  Will watch patient in the ED for 4 hours after Narcan injection Lengthy discussion with patient about use of narcotics and subsequent operation of motor vehicles and how this is detrimental to both his health and the general public. No suicidal or homicidal ideation. Upon reevaluation at 1730, patient is ambulatory, alert and oriented 4 and GCS 15. Patient appears to be at his baseline. Heart and lung exams normal. Discussed further outpatient resources for substance abuse, resource guide given. Also given referral to the health and wellness in order to establish primary care and follow-up on an other medical problems. Prior to patient discharge, I discussed and reviewed this case with Dr. Adriana Simas, who also saw and evaluated the patient and agrees the plan for discharge.    Final diagnoses:  Drug overdose, accidental or unintentional, initial encounter       Joycie Peek, PA-C 10/03/14 6045  Donnetta Hutching, MD 10/05/14 0930

## 2014-10-02 NOTE — Discharge Instructions (Signed)
Physical therapy to follow-up with any other substance abuse programs attached on the resource guide. It is also important for you to not operate any machinery or motor vehicles all under the influence of drugs. Return to ED for worsening symptoms.  Accidental Overdose A drug overdose occurs when a chemical substance (drug or medication) is used in amounts large enough to overcome a person. This may result in severe illness or death. This is a type of poisoning. Accidental overdoses of medications or other substances come from a variety of reasons. When this happens accidentally, it is often because the person taking the substance does not know enough about what they have taken. Drugs which commonly cause overdose deaths are alcohol, psychotropic medications (medications which affect the mind), pain medications, illegal drugs (street drugs) such as cocaine and heroin, and multiple drugs taken at the same time. It may result from careless behavior (such as over-indulging at a party). Other causes of overdose may include multiple drug use, a lapse in memory, or drug use after a period of no drug use.  Sometimes overdosing occurs because a person cannot remember if they have taken their medication.  A common unintentional overdose in young children involves multi-vitamins containing iron. Iron is a part of the hemoglobin molecule in blood. It is used to transport oxygen to living cells. When taken in small amounts, iron allows the body to restock hemoglobin. In large amounts, it causes problems in the body. If this overdose is not treated, it can lead to death. Never take medicines that show signs of tampering or do not seem quite right. Never take medicines in the dark or in poor lighting. Read the label and check each dose of medicine before you take it. When adults are poisoned, it happens most often through carelessness or lack of information. Taking medicines in the dark or taking medicine prescribed for  someone else to treat the same type of problem is a dangerous practice. SYMPTOMS  Symptoms of overdose depend on the medication and amount taken. They can vary from over-activity with stimulant over-dosage, to sleepiness from depressants such as alcohol, narcotics and tranquilizers. Confusion, dizziness, nausea and vomiting may be present. If problems are severe enough coma and death may result. DIAGNOSIS  Diagnosis and management are generally straightforward if the drug is known. Otherwise it is more difficult. At times, certain symptoms and signs exhibited by the patient, or blood tests, can reveal the drug in question.  TREATMENT  In an emergency department, most patients can be treated with supportive measures. Antidotes may be available if there has been an overdose of opioids or benzodiazepines. A rapid improvement will often occur if this is the cause of overdose. At home or away from medical care:  There may be no immediate problems or warning signs in children.  Not everything works well in all cases of poisoning.  Take immediate action. Poisons may act quickly.  If you think someone has swallowed medicine or a household product, and the person is unconscious, having seizures (convulsions), or is not breathing, immediately call for an ambulance. IF a person is conscious and appears to be doing OK but has swallowed a poison:  Do not wait to see what effect the poison will have. Immediately call a poison control center (listed in the white pages of your telephone book under "Poison Control" or inside the front cover with other emergency numbers). Some poison control centers have TTY capability for the deaf. Check with your local center if  you or someone in your family requires this service.  Keep the container so you can read the label on the product for ingredients.  Describe what, when, and how much was taken and the age and condition of the person poisoned. Inform them if the person  is vomiting, choking, drowsy, shows a change in color or temperature of skin, is conscious or unconscious, or is convulsing.  Do not cause vomiting unless instructed by medical personnel. Do not induce vomiting or force liquids into a person who is convulsing, unconscious, or very drowsy. Stay calm and in control.   Activated charcoal also is sometimes used in certain types of poisoning and you may wish to add a supply to your emergency medicines. It is available without a prescription. Call a poison control center before using this medication. PREVENTION  Thousands of children die every year from unintentional poisoning. This may be from household chemicals, poisoning from carbon monoxide in a car, taking their parent's medications, or simply taking a few iron pills or vitamins with iron. Poisoning comes from unexpected sources.  Store medicines out of the sight and reach of children, preferably in a locked cabinet. Do not keep medications in a food cabinet. Always store your medicines in a secure place. Get rid of expired medications.  If you have children living with you or have them as occasional guests, you should have child-resistant caps on your medicine containers. Keep everything out of reach. Child proof your home.  If you are called to the telephone or to answer the door while you are taking a medicine, take the container with you or put the medicine out of the reach of small children.  Do not take your medication in front of children. Do not tell your child how good a medication is and how good it is for them. They may get the idea it is more of a treat.  If you are an adult and have accidentally taken an overdose, you need to consider how this happened and what can be done to prevent it from happening again. If this was from a street drug or alcohol, determine if there is a problem that needs addressing. If you are not sure a problems exists, it is easy to talk to a professional and ask  them if they think you have a problem. It is better to handle this problem in this way before it happens again and has a much worse consequence. Document Released: 04/20/2004 Document Revised: 04/29/2011 Document Reviewed: 09/26/2008 [facility] Patient Information 2015 [place], [place]. This information is not intended to replace advice given to you by your health care provider. Make sure you discuss any questions you have with your health care provider.    Emergency Department Resource Guide 1) Find a Doctor and Pay Out of Pocket Although you won't have to find out who is covered by your insurance plan, it is a good idea to ask around and get recommendations. You will then need to call the office and see if the doctor you have chosen will accept you as a new patient and what types of options they offer for patients who are self-pay. Some doctors offer discounts or will set up payment plans for their patients who do not have insurance, but you will need to ask so you aren't surprised when you get to your appointment.  2) Contact Your Local Health Department Not all health departments have doctors that can see patients for sick visits, but many do, so it  is worth a call to see if yours does. If you don't know where your local health department is, you can check in your phone book. The CDC also has a tool to help you locate your state's health department, and many state websites also have listings of all of their local health departments.  3) Find a Walk-in Clinic If your illness is not likely to be very severe or complicated, you may want to try a walk in clinic. These are popping up all over the country in pharmacies, drugstores, and shopping centers. They're usually staffed by nurse practitioners or physician assistants that have been trained to treat common illnesses and complaints. They're usually fairly quick and inexpensive. However, if you have serious medical issues or chronic medical problems,  these are probably not your best option.  No Primary Care Doctor: - Call Health Connect at  [phone] - they can help you locate a primary care doctor that  accepts your insurance, provides certain services, etc. - Physician Referral Service- [phone]  Chronic Pain Problems: Organization         Address  Phone   Notes  [facility]  [phone] Patients need to be referred by their primary care doctor.   Medication Assistance: Organization         Address  Phone   Notes  [facility] Medication [facility] [address] [place], [place] [address] [phone] --Must be a resident of [facility] -- Must have NO insurance coverage whatsoever (no Medicaid/ Medicare, etc.) -- The pt. MUST have a primary care doctor that directs their care regularly and follows them in the community   [facility]  [phone]   [employer]  [phone]    Agencies that provide inexpensive medical care: Organization         Address  Phone   Notes  [facility]  [phone]   [facility]    [phone]   [facility] [address] [place], [place] [address] [phone]   [facility] [address] [place] [place] [phone]   Planned Parenthood    [phone]   [facility]    [phone]   [facility]  [address], [place] Phone:  [phone], Fax:  [phone] Hours of Operation:  9 am - 6 pm, M-F.  Also accepts Medicaid/Medicare and self-pay.  [facility]  [address], [place] Phone: [phone], Fax: [phone]. Hours of Operation:  8:30 am - 5:30 pm, M-F.  Also accepts Medicaid and self-pay.  [facility] [place] [address] [place] Phone: [phone]   [facility] [address] Natasha Bence [place], [place] [phone], Ext. [phone] Mondays &  Thursdays: 7-9 AM.  First 15 patients are seen on a first come, first serve basis.    Medicaid-accepting [facility] Providers:  Organization         Address  Phone   Notes  [facility] [address],  [phone] Also accepts self-pay patients.  [facility] [address] Laurell Josephs [place], [place]  [phone]   [facility] [address], [place] [phone]   [facility] [address], [place] [phone]   Renaye Rakers [address]  [address], [place]   [phone] Only accepts [place] patients after they have their name applied to their card.   Self-Pay (no insurance) in [facility]:  Organization         Address  Phone   Notes  Sickle Cell Patients, [facility] [address] [place], [place] [phone]   [facility] [address] [place], [place] [phone]   [facility] [place]  [address], [place] [phone]   [facility]/Dr. Osei-Bonsu  [address], [place] or [address], [place] [phone] Phone number for both [place] and [place] locations is the same.  [facility]gent Medical and [facility] [address], [place] [phone]   [facility] [address] [place] or [address] [phone]   [facility] [address] [place] [phone], phone; [phone], fax Sees patients 1st and 3rd Saturday of every month.  Must not qualify for public or private insurance (i.e. Medicaid, Medicare, [place] Health Choice, Veterans' Benefits)  Household income should be no more than 200% of the poverty level The clinic cannot treat you if you are pregnant or think you are pregnant  Sexually transmitted diseases are not treated at the  clinic.    Dental Care: Organization         Address  Phone  Notes  [facility] [address] [place], [place] [phone] Accepts children up to age 26 who are enrolled in [place] or [place] Health Choice; pregnant women with a Medicaid card; and children who have applied for Medicaid or [place] Health Choice, but were declined, whose parents can pay a reduced fee at time of service.  [facility] of [facility]  [address], [place] [phone] Accepts children up to age 34 who are enrolled in [place] or [place] Health Choice; pregnant women with a Medicaid card; and children who have applied for Medicaid or [place] Health Choice, but were declined, whose parents can pay a reduced fee at time of service.  [facility]  [address] [place], [place] [phone] Patients are seen by appointment only. Walk-ins are not accepted. [facility] will see patients 60 years of age and older. Monday - Tuesday (8am-5pm) Most Wednesdays (8:30-5pm) $30 per visit, cash only  [facility] Adult Dental Access PRO[facility]  [address], [facility] [phone] Patients are seen by appointment only. Walk-ins are not accepted. [facility] will see patients 57 years of age and older. One Wednesday Evening (Monthly: Volunteer Based).  $30 per visit, cash only  [employer]  [phone] for adults; Children under age 16, call Graduate Pediatric Dentistry at [phone]. Children aged 62-14, please call [phone] to request a pediatric application.  Dental services are provided in all areas of dental care including fillings, crowns and bridges, complete and partial dentures, implants, gum treatment, root canals, and extractions. Preventive care is also provided. Treatment is provided to both adults and children. Patients are selected via a lottery and there is often a  waiting list.   [facility] [address] [place]  [phone] [email or web address]   [facility] [address] [place], [place] [phone], Ext. [phone] Second and Fourth Thursday  of each month, opens at 6:30 AM; Clinic ends at 9 AM.  Patients are seen on a first-come first-served basis, and a limited number are seen during each clinic.   [facility]  [address] Ether Griffins [place], [place] [phone]   Eligibility Requirements You must have lived in [place], [place], or [place] counties for at least the last three months.   You cannot be eligible for state or federal sponsored [place], including [employer], [place], or [employer].   You generally cannot be eligible for healthcare insurance through your employer.    How to apply: Eligibility screenings are held every Tuesday and Wednesday afternoon from 1:00 pm until 4:00 pm. You do not need an appointment for the interview!  [facility] [address], [place], [place] [phone]   [facility] Health Department  [phone]   [facility] Health Department  [phone]   [facility] Health Department  [phone]    Behavioral Health Resources in the Community: Intensive Outpatient Programs Organization         Address  Phone  Notes  [facility] [address], [place], [place] [phone]   [facility] [address], [place], [place] [phone]   [facility] [address] [place], [place]  [phone]   [facility] Mental Health [address],  [place], [place] [phone] or [phone]   Substance Abuse Resources Organization         Address  Phone  Notes  [facility] Drug [facility]  [phone]   [facility]  [phone]   [facility] [place]  [phone]   Floydene Flock  [phone]   Residential & Outpatient Substance Abuse  Program  [phone]   Psychological Services Organization         Address  Phone  Notes  [facility] Behavioral Health  [phone]   [facility]  [phone]   [facility] Mental Health [address] [place] [phone] or [phone]    Mobile Crisis Teams Organization         Address  Phone  Notes  [facility], Mobile Crisis Care Unit  [phone]   [facility]  [address] [place], [place] [phone]   Doristine Locks [address] [place] [phone]    Self-Help/Support Groups Organization         Address  Phone             Notes  [facility] [place] - variety of support groups  [phone]- I7437963 Call for more information  Narcotics Anonymous (NA), [facility] [address], [employer] [place]  2 meetings at this location   Statistician         Address  Phone  Notes  [facility] [address],    [place]  [phone]   [facility]  [address], [place], [place] [phone]   [facility] [address] [place], [place] [phone] Admissions: 8am-3pm M-F  [facility] [address]    [place], [place] [phone]   [facility] [address] Starling Manns [place], [place] [phone]   [facility] [address]  [place], [place] [phone]   [facility] - Intensive Outpatient [address] Laurell Josephs [address], [place], [place] [phone]   [facility] ([facility] [address]  [place], [place] [phone] or [phone]   [facility] ([facility]) [address] [place], [place] [phone] Accepts Medicaid  [facility] [address]  [place] [phone] Substance Abuse/Addiction Treatment   [facility] Behavioral Health Resources Organization          Address  Phone  Notes  [facility]  [phone]) [phone]   Angie FavaJulie Brannon, PhD [address] [place], [place]   [phone] or [phone]   [facility]   [address] [place], [place] [phone]) [phone]   [facility] [address], [place], [place] [phone] Insurance/Medicaid/sponsorship through [facility]  [facility] [address]                                    [place], [place] [phone]) [phone] Therapy/tele-psych/case  [facility] [address]   [place], [place] [phone]    Dr. Lolly MustacheArfeen  [phone]   [facility] [place]  [employer] [facility] 1) [address], [place] 2) [address], [place] 3)  [address], [place] [phone]) [phone]  [phone](336) [phone]   [facility] Child Abuse Hotline [phone]) [phone] or [phone]) [phone] (After Hours)

## 2014-10-02 NOTE — ED Notes (Addendum)
Pt in via EMS after being found unresponsive in his car. Upon arrival pt was found to have agonal respirations @ approx 8. Pt received a total of 4 Narcan. Pt aroused after the narcan admin.Pt admits to using $20 worth of heroin prior to being found. Pt confirms that he OD on heroin approx 2 weeks ago and was brought to same facility. Pt is now A&O and in NAD

## 2014-10-02 NOTE — ED Notes (Signed)
Bed: RESA Expected date: 10/02/14 Expected time: 5:47 PM Means of arrival: Ambulance Comments: Heroin OD Narcan given

## 2014-10-02 NOTE — ED Notes (Signed)
Informed Pt of need of urine specimen. Pt stated "You just took my blood can't you test the blood?" Pt does not want to give specimen at this time. Urinal at bedside

## 2016-07-29 ENCOUNTER — Encounter (HOSPITAL_COMMUNITY): Payer: Self-pay | Admitting: Emergency Medicine

## 2016-07-29 ENCOUNTER — Emergency Department (HOSPITAL_COMMUNITY)
Admission: EM | Admit: 2016-07-29 | Discharge: 2016-07-30 | Disposition: A | Discharge status: Home / Self Care | Payer: Self-pay | Attending: Emergency Medicine | Admitting: Emergency Medicine

## 2016-07-29 DIAGNOSIS — F1910 Other psychoactive substance abuse, uncomplicated: Secondary | ICD-10-CM | POA: Insufficient documentation

## 2016-07-29 DIAGNOSIS — F17210 Nicotine dependence, cigarettes, uncomplicated: Secondary | ICD-10-CM | POA: Insufficient documentation

## 2016-07-29 NOTE — ED Triage Notes (Signed)
Pt report wanting to get help with detox. Pt reports to using cocaine today, heroine yesterday and has used molly. Pt currently alert and oriented x 4 no acute distress.

## 2016-07-30 NOTE — ED Notes (Signed)
ED Provider at bedside. 

## 2016-07-30 NOTE — ED Provider Notes (Addendum)
   [facility] Provider Note: Lowella DellJ. Lane Donell Sliwinski, MD, FACEP  CSN: [id number] MRN: [id number] ARRIVAL: 07/29/16 at 2100 ROOM: WA24/WA24   CHIEF COMPLAINT  Drug Problem   HISTORY OF PRESENT ILLNESS  Renee HarderJoshua Hansen is a 31 y.o. male who has been using heroin, cocaine and ecstasy for the past 2 or 3 weeks. He called his mother asking for help yesterday evening and she found him in his car shooting up drugs. He is agitated and restless but denies suicidal or homicidal ideation. He denies pain or physical complaints other than the restlessness. He denies alcohol or benzodiazepine abuse.   History reviewed. No pertinent past medical history.  History reviewed. No pertinent surgical history.  History reviewed. No pertinent family history.  Social History  Substance Use Topics  . Smoking status: Current Every Day Smoker    Packs/day: 1.00    Types: Cigarettes  . Smokeless tobacco: Never Used  . Alcohol use No    Prior to Admission medications   Medication Sig Start Date End Date Taking? Authorizing Provider  naloxone [facility](NARCAN) 0.4 MG/ML injection Inject 1 mL (0.4 mg total) into the muscle as needed. Naloxone Autoinjector Patient not taking: Reported on 07/29/2016 09/19/14   Dione BoozeGlick, David, MD    Allergies Patient has no known allergies.   REVIEW OF SYSTEMS  Negative except as noted here or in the History of Present Illness.   PHYSICAL EXAMINATION  Initial Vital Signs Blood pressure 112/71, pulse 71, temperature 99.9 F (37.7 C), temperature source Oral, resp. rate 17, height 5\' 9"  (1.753 m), weight 72.6 kg (160 lb), SpO2 96 %.  Examination General: Well-developed, well-nourished male in no acute distress; appearance consistent with age of record HENT: normocephalic; atraumatic Eyes: pupils equal, round and reactive to light; extraocular muscles intact Neck: supple Heart: regular rate and rhythm Lungs: clear to auscultation bilaterally Abdomen: soft; nondistended;  nontender; bowel sounds present Extremities: No deformity; full range of motion; pulses normal Neurologic: Awake, alert and oriented x 2; motor function intact in all extremities and symmetric; no facial droop; fidgety Skin: Warm and dry Psychiatric: Flat affect; mildly agitated; no SI; no HI   RESULTS  Summary of this visit's results, reviewed by myself:   EKG Interpretation  Date/Time:    Ventricular Rate:    PR Interval:    QRS Duration:   QT Interval:    QTC Calculation:   R Axis:     Text Interpretation:        Laboratory Studies: No results found for this or any previous visit (from the past 24 hour(s)). Imaging Studies: No results found.  ED COURSE  Nursing notes and initial vitals signs, including pulse oximetry, reviewed.  Vitals:   07/29/16 2114 07/29/16 2115 07/29/16 2356 07/30/16 0000  BP: 124/77  112/71 115/72  Pulse: 86  71 77  Resp: (!) 22  17   Temp: 99.9 F (37.7 C)     TempSrc: Oral     SpO2: 100%  96% 97%  Weight:  72.6 kg (160 lb)    Height:  5\' 9"  (1.753 m)     The patient does not meet inpatient criteria at this time. We'll provide referrals to outpatient facilities.  PROCEDURES    ED DIAGNOSES     ICD-10-CM   1. Polysubstance abuse F19.10        Keawe Marcello, MD 07/30/16 0032    Paula LibraMolpus, Andrina Locken, MD 07/30/16 [id number]

## 2016-07-30 NOTE — ED Notes (Signed)
Pt. Walked out of room soon after told by Dr. Read DriversMolpus  that he is going to be discharge and will be given list reference of  resources to help him.  Discharge papers given to mother.

## 2016-11-09 ENCOUNTER — Emergency Department (HOSPITAL_COMMUNITY): Payer: Self-pay

## 2016-11-09 ENCOUNTER — Emergency Department (HOSPITAL_COMMUNITY)
Admission: EM | Admit: 2016-11-09 | Discharge: 2016-11-09 | Disposition: A | Discharge status: Home / Self Care | Payer: Self-pay | Attending: Emergency Medicine | Admitting: Emergency Medicine

## 2016-11-09 ENCOUNTER — Encounter (HOSPITAL_COMMUNITY): Payer: Self-pay | Admitting: Obstetrics and Gynecology

## 2016-11-09 DIAGNOSIS — T401X1A Poisoning by heroin, accidental (unintentional), initial encounter: Secondary | ICD-10-CM | POA: Insufficient documentation

## 2016-11-09 DIAGNOSIS — F17210 Nicotine dependence, cigarettes, uncomplicated: Secondary | ICD-10-CM | POA: Insufficient documentation

## 2016-11-09 DIAGNOSIS — J690 Pneumonitis due to inhalation of food and vomit: Secondary | ICD-10-CM | POA: Insufficient documentation

## 2016-11-09 MED ORDER — AMOXICILLIN-POT CLAVULANATE 875-125 MG PO TABS
1.0000 | ORAL_TABLET | Freq: Once | ORAL | Status: AC
  Administered 2016-11-09: 1 via ORAL
  Filled 2016-11-09: qty 1

## 2016-11-09 MED ORDER — ALBUTEROL SULFATE HFA 108 (90 BASE) MCG/ACT IN AERS
1.0000 | INHALATION_SPRAY | RESPIRATORY_TRACT | Status: DC
  Administered 2016-11-09: 2 via RESPIRATORY_TRACT
  Filled 2016-11-09: qty 6.7

## 2016-11-09 NOTE — ED Notes (Signed)
Bed: RESB Expected date:  Expected time:  Means of arrival:  Comments: Heroin OD 

## 2016-11-09 NOTE — ED Notes (Addendum)
Pt has a substance on the side of the face and reports he may have swallowed some emesis or "something" when he was unconscious.

## 2016-11-09 NOTE — ED Triage Notes (Signed)
Per EMS: Pt is coming from home GF called EMS because she could not get into the bathroom and he was not responsive.  EMS found pt unresponsive. He received  of narcan intranasal. Pt woke up with narcan.  Alert and oriented x4 after narcan.  Pt agreed to be evaluated.  CBG 347 Last Vitals 131/80 HR 105  SPO2 94% RA

## 2016-11-09 NOTE — Discharge Instructions (Signed)
Aspiration pneumonia can slowly improve, and it can worsen. Please return here if you develop shortness of breath, bloody cough, fever, or any worsening.

## 2016-11-09 NOTE — ED Provider Notes (Signed)
[facility] Provider Note   CSN: [id number] Arrival date & time: 11/09/16  1517     History   Chief Complaint Chief Complaint  Patient presents with  . Drug Overdose    HPI Benjamin Hansen is a 31 y.o. male. CC:  Heroin overdose with narcan rescue.   HPI:  31 year old male. 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 States he was at work today. He came home. He states he does girlfriend he was going to shower. He states he was injecting heroin in the bathroom and attempt to hide it from her. She probably found him on the floor unresponsive. 911 was called. It is unknown to me if she performed bystander CPR or rescue breaths. Upon arrival paramedics he was unresponsive and apneic. Given intranasal followed by IM Narcan. He became awake and alert. Had episode of emesis in route. Arrives here with no recollection for the events but does recall shooting heroin. States this was an intentional recreational. He is not depressed or suicidal. States he was clean for 2 months. He was a previous frequent but not daily heroin user until 2 months ago. This second time he has used IV narcotics since he had his 2 months of being clean. States he usually follows a familiar amount,  but alleges that he may have "done too much".  History reviewed. No pertinent past medical history.  There are no active problems to display for this patient.   History reviewed. No pertinent surgical history.     Home Medications    Prior to Admission medications   Medication Sig Start Date End Date Taking? Authorizing Provider  acetaminophen (TYLENOL) 500 MG tablet Take 1,000 mg by mouth every 6 (six) hours as needed for mild pain or headache.   Yes [provider]  Multiple Vitamin (MULTIVITAMIN WITH MINERALS) TABS tablet Take 1 tablet by mouth daily.   Yes [provider]    Family History No family history on file.  Social History Social History  Substance Use Topics  . Smoking status: Current Every Day  Smoker    Packs/day: 1.00    Types: Cigarettes  . Smokeless tobacco: Never Used  . Alcohol use No     Allergies   Patient has no known allergies.   Review of Systems Review of Systems  Constitutional: Negative for appetite change, chills, diaphoresis, fatigue and fever.  HENT: Positive for sore throat. Negative for mouth sores and trouble swallowing.   Eyes: Negative for visual disturbance.  Respiratory: Positive for cough. Negative for chest tightness, shortness of breath and wheezing.   Cardiovascular: Negative for chest pain.  Gastrointestinal: Negative for abdominal distention, abdominal pain, diarrhea, nausea and vomiting.  Endocrine: Negative for polydipsia, polyphagia and polyuria.  Genitourinary: Negative for dysuria, frequency and hematuria.  Musculoskeletal: Negative for gait problem.  Skin: Negative for color change, pallor and rash.  Neurological: Negative for dizziness, syncope, light-headedness and headaches.  Hematological: Does not bruise/bleed easily.  Psychiatric/Behavioral: Negative for behavioral problems and confusion.     Physical Exam Updated Vital Signs BP 118/68 (BP Location: Left Arm)   Pulse 97   Temp 97.7 F (36.5 C) (Oral)   Resp 20   Ht  (1.753 m)   Wt 72.6 kg (160 lb)   SpO2 92%   BMI 23.63 kg/m   Physical Exam  Constitutional: He is oriented to person, place, and time. He appears well-developed and well-nourished. No distress.  31 year old male awake and alert. Had some emesis on the side of his  head. Pupils 4 mm reactive. Erythematous pharynx  HENT:  Head: Normocephalic.  Eyes: Pupils are equal, round, and reactive to light. Conjunctivae are normal. No scleral icterus.  Neck: Normal range of motion. Neck supple. No thyromegaly present.  Cardiovascular: Normal rate and regular rhythm.  Exam reveals no gallop and no friction rub.   No murmur heard. Pulmonary/Chest: Effort normal and breath sounds normal. No respiratory distress.  He has no wheezes. He has no rales.  Occasional cough. 93% saturations. No wheezing. Clear breath sounds. No increased work of breathing.  Abdominal: Soft. Bowel sounds are normal. He exhibits no distension. There is no tenderness. There is no rebound.  Musculoskeletal: Normal range of motion.  Neurological: He is alert and oriented to person, place, and time.  Skin: Skin is warm and dry. No rash noted.  Psychiatric: He has a normal mood and affect. His behavior is normal.     ED Treatments / Results  Labs (all labs ordered are listed, but only abnormal results are displayed) Labs Reviewed - No data to display  EKG  EKG Interpretation None       Radiology Dg Chest 2 View  Result Date: 11/09/2016 CLINICAL DATA:  Unresponsive EXAM: CHEST  2 VIEW COMPARISON:  06/21/2010 FINDINGS: Cardiac shadow is within normal limits. Diffuse infiltrative changes are noted in the mid and upper lung on the left. No sizable effusion is seen. No bony abnormality is noted. IMPRESSION: Patchy infiltrate throughout the left upper and mid lung. Electronically Signed   By: Alcide Clever M.D.   On: 11/09/2016 16:21    Procedures Procedures (including critical care time)  Medications Ordered in ED Medications  albuterol (PROVENTIL HFA;VENTOLIN HFA) 108 (90 Base) MCG/ACT inhaler 1-2 puff (not administered)  amoxicillin-clavulanate (AUGMENTIN) 875-125 MG per tablet 1 tablet (not administered)     Initial Impression / Assessment and Plan / ED Course  I have reviewed the triage vital signs and the nursing notes.  Pertinent labs & imaging results that were available during my care of the patient were reviewed by me and considered in my medical decision making (see chart for details).    Heroin overdose with Narcan rescue with emesis. Rule out aspiration. We'll observe here and repeat pulmonary exam. Follow saturations. Chest x-ray. Remains well oxygenated, and minimally symptomatic, he may be eligible for  discharge.  Final Clinical Impressions(s) / ED Diagnoses   Final diagnoses:  Accidental overdose of heroin, initial encounter  Aspiration pneumonia of left upper lobe due to gastric secretions [facility])    Patient has signs of left upper lobe aspiration. On exam has occasional wheezing. No distress. 94% at rest. He is 98% with ambulation. I discussed inpatient versus outpatient treatment. He has a strong desire to be treated as an outpatient and in fact declines inpatient treatment I do not think this is unreasonable. He was instructed in the use of incentive spirometer. Given albuterol inhaler to use at home for cough. Dose of Augmentin here. Prescription for the same. Vascular to return of bloody cough worsening symptoms, shortness of breath, fever, other new symptoms.  New Prescriptions New Prescriptions   No medications on file     Rolland Porter, MD 11/09/16 1722

## 2017-03-26 ENCOUNTER — Telehealth: Payer: Self-pay | Admitting: *Deleted

## 2017-03-26 NOTE — Telephone Encounter (Signed)
Pt calls after speaking to a relative about his addiction  7 to 8 years IV heroin, weed and cocaine, only uses cocaine on weekends Never been to oud clinics before, never prescribed suboxone though bought it on street Did get prescribed methadone 2 1/2 years ago maybe 3 but doesn't remember much about it Not opposed to referral to psych Overdosed multiple times, dont know a number to put on it "im through, im going to die, my family told me they would help me, my grandma wants me to, I want to" Ph# [phone] grma [phone]

## 2017-03-26 NOTE — Telephone Encounter (Signed)
appt [facility] 2/7 at 1515

## 2017-03-27 ENCOUNTER — Ambulatory Visit (INDEPENDENT_AMBULATORY_CARE_PROVIDER_SITE_OTHER): Payer: Self-pay | Admitting: Internal Medicine

## 2017-03-27 ENCOUNTER — Encounter: Payer: Self-pay | Admitting: Pharmacist

## 2017-03-27 ENCOUNTER — Other Ambulatory Visit: Payer: Self-pay

## 2017-03-27 DIAGNOSIS — F1120 Opioid dependence, uncomplicated: Secondary | ICD-10-CM

## 2017-03-27 DIAGNOSIS — F1199 Opioid use, unspecified with unspecified opioid-induced disorder: Secondary | ICD-10-CM

## 2017-03-27 DIAGNOSIS — F1190 Opioid use, unspecified, uncomplicated: Secondary | ICD-10-CM

## 2017-03-27 DIAGNOSIS — R768 Other specified abnormal immunological findings in serum: Secondary | ICD-10-CM

## 2017-03-27 HISTORY — DX: Opioid use, unspecified, uncomplicated: F11.90

## 2017-03-27 MED ORDER — BUPRENORPHINE HCL-NALOXONE HCL 8-2 MG SL SUBL: 1.0000 | SUBLINGUAL_TABLET | Freq: Two times a day (BID) | SUBLINGUAL | 0 refills | Status: AC

## 2017-03-27 NOTE — Assessment & Plan Note (Addendum)
Patient presents requesting Suboxone. Reports onset of drug use at the age of 32. He started with marijuana and progressed to cocaine and IV heroin. He now uses IV heroin every day, about 1-2 grams. He also continues to use cocaine and marijuana sporadically on weekends. Has also tried Xanax in the past but not recently. He last used heroin this morning. Always injects himself in the arms. Does not use clean needles all the time. States he does experience withdrawal symptoms in the morning: cold sweats, anxiety, weakness, and restless legs. States he has an aunt and cousin that are on Suboxone who are motivating him to start treatment. His mother and grandmother support him. He has tried methadone treatment in the past but states it was too expensive for him. He has also tried [facility] 4-5 years ago and [facility] 6 months ago after which he stayed sober for 65 days. Reports at least 4 episodes of drug overdose in the past year.   - Buprenorphine/Naloxone sent to requested pharmacy  - Patient counseled on how and when to start medication  - CMP, HIV, Hep C, Utox  - Follow up in 1 week

## 2017-03-27 NOTE — Progress Notes (Addendum)
03/27/2017  Benjamin HarderJoshua Hansen presents for buprenorphine/naloxone intake visit.   I have reviewed EPIC data including labwork which was available.  I have reviewed outside records provided by patient if available.  The salient points were confirmed with the patient.    Review of substance use history (first use, substances used, any illicit purchases): Patient reports onset of drug use at the age of 32. He started with marijuana and progressed to cocaine and IV heroin. He now uses IV heroin every day, about 1-2 grams. He also continues to use cocaine and marijuana sporadically. Has also tried Xanax in the past, not recently.   Last substance used: IV heroin this morning (2/7)  If last substance not an opioid, last opioid used (type, dose, route, withdrawal symptoms): IV heroin this morning (2/7)  Mental Health History: None   Current counseling/behavioural health provider: None currently. [facility] 4-5 years ago and [facility] 6 months ago.   This patient has Opioid Use Disorder by following DSM-V criteria: Keep those that apply.  - Opioids taken in larger amounts or over a longer period than intended - Persistent desire to cut down - A great deal of time is spent to obtain/use/recover from the opioid - Cravings to use opioids - Use resulting in a failure to fulfill major role obligations - Continue opioid use despite persistent social or interpersonal problems - Important activities are given up or reduced because of opioid use - Use despite knowledge of health problems caused by opioids - Tolerance - Withdrawal   No past medical history on file.  Current Outpatient Medications on File Prior to Visit  Medication Sig Dispense Refill  . acetaminophen (TYLENOL) 500 MG tablet Take 1,000 mg by mouth every 6 (six) hours as needed for mild pain or headache.    . Multiple Vitamin (MULTIVITAMIN WITH MINERALS) TABS tablet Take 1 tablet by mouth daily.     No current facility-administered  medications on file prior to visit.     Physical Exam  Vitals:   03/27/17 1534  BP: (!) 109/59  Pulse: 69  Temp: 98.3 F (36.8 C)  TempSrc: Oral  SpO2: 96%  Weight: 159 lb 9.6 oz (72.4 kg)  Height: 5\' 10"  (1.778 m)   General: pleasant male, unkempt, well-developed, in no acute distress  HENT: NCAT, neck supple and FROM, no lymphadenopathy, OP clear without exudates or erythema, MMM  Eyes: anicteric sclera, PERRL Cardiac: regular rate and rhythm, nl S1/S2, no murmurs, rubs or gallops  Pulm: CTAB, no wheezes or crackles, no increased work of breathing  Abd: soft, NTND, normoactive bowel sounds Neuro: A&Ox3, no focal deficits noted   Ext: warm and well perfused, no peripheral edema noted  Derm: track marks noted on L antecubital fossa and on medial aspect of R antecubital fossa   Clinical Opiate Withdrawal Scale: bold applicable COWS scoring   - Resting HR:    - 0 for < 80   - 1 for 81 - 100   - 2 for 101 - 120   - 4 for > 120  - Sweating:   - 0 for no chills/flushing   - 1 for subjective chills/flushing   - 3 for beads of sweat on brow/face   - 4 for sweat streaming off of face  - Restlessness:    - 0 for able to sit still   - 1 for subjective difficulty sitting still   - 3 for frequent shifting or extraneous movement   - 5 for unable to  sit still for more than a few seconds  - Pupil size:    - 0 for pinpoint or normal   - 1 for possibly larger than normal   - 2 for moderately dilated   - 5 for only iris rim visible  - Bone/joint pain:    - 0 for not present   - 1 for mild diffuse discomfort   - 2 severe diffuse aching   - 4 for objectively rubbing joints/muscles and obviously in pain  - Runny nose/tearing:    - 0 for not present   - 1 for stuffy nose/moist eyes   - 2 for nose running/tearing   - 4 for nose constantly running or tears streaming down cheeks  - GI Upset:    - 0 for no GI symptoms   - 1 for stomach cramps   - 2 for nausea or loose stool   - 3  for vomiting or diarrhea   - 5 for multiple episodes of vomiting or diarrhea  - Tremor observation of outstretched hands:    - 0 for no tremor   - 1 for tremor can be felt but not observed   - 2 for slight tremor observable   - 4 for gross tremor or muscle twitching  - Yawning:    - 0 for no yawning   - 1 for yawning once or twice during assessment   - 2 for yawning three or more times during assessment   - 4 for yawning several times per minute  - Anxiety or irritability:    - 0 for none   - 1 for patient reports increasing irritability or anxiousness   - 2 for patient obviously irritable/anxious   - 4 for patient so irritable/anxious that assessment is difficult  - Gooseflesh:    - 0 for skin is smooth   - 3 for piloerection of skin can be felt or seen   - 5 for prominent piloerection  TOTAL: 0  Assessment/Plan:   Based on a review of the patient's medical history including substance use and mental health factors, and physical exam, Benjamin Hansen is a suitable candidate for MAT with buprenorphine/naloxone.  UDS ordered this visit.    I have discussed HIV and Hepatitis C screening with this patient.    I will place orders for [facility] for baseline LFT evaluation if needed.    Home Induction:   I have instructed the patient how to appropriately take this medication, including placing under the tongue with head relaxed for 10 minutes and allowing to dissolve without chewing or swallowing tab/film, and with nothing to eat or drink in the subsequent 15 minutes.  They have been told not to start taking the medication until they have significant signs of withdrawal and I have explained the concept of precipitated withdrawal with the patient.    Intervisit Care:  I will call the patient the morning after induction to assess symptom burden and determine the need for additional dose titration.  We discussed this medication must be kept in a safe place and away from children.   We will  see the patient back in 1 week in clinic, with options for a sooner appointment based on patient and provider preference.   Patient was encouraged to call the office and speak with the MD on call for any urgent concerns.    Burna Cash, MD 03/27/2017 4:45 PM

## 2017-03-27 NOTE — Patient Instructions (Addendum)
Please come back to clinic in 1 week (next Thursday).  Instruction for starting buprenorphine-naloxone (Suboxone) at home  You should not mix buprenorphine-naloxone with other drugs especially large amounts of alcohol or benzodiazepines (Valium, Klonopin, Xanax, Ativan). If you have taken any of these medication, please tell your healthcare team and do not take buprenorphine-naloxone.   You must wait until you are feeling signs of withdrawal from opiates (heroin, pain pills) before you take buprenorphine-naloxone.  If you do not wait long enough the medication will make you sicker.  If you do take it too soon and get sicker then wait until later when you feel signs of withdrawal listed below and then try again.   Signs that you are withdrawing: ? Anxiety, restlessness, can't sit still ? Aches ? Nausea or sick to your stomach ? Goose-bumps ? Racing heart   You should have ALL of these symptoms before you start taking your first dose of buprenorphine-naloxone. If you are not sure call your healthcare team.    When it's time to take your first dose 1. Split your pill or film in half 2. Make sure your mouth is empty of everything (no candy/gum/etc) 3. Sit or stand, but do not lie down 4. Swallow a sip of water to wet your mouth  5. Put the half of the tablet or film under your tongue. Do not suck or swallow it. It must stay there until it is completely dissolved. Try to not even swallow your spit during this time. Anything that you swallow will not make you feel better.   In 20 minutes: You should start feeling a little better. If you feel worse then you started too early so you would wait a few hours and then try again later.    In one hour: You can take the other half of the pill or film the same way you took the first one.   In 2 hours: if you are still feeling symptoms of withdrawal listed above you can take another half a pill or film. You can repeat this if needed until you take a total  of 2 pills or 2 films (16mg ). You may need less than this to control your symptoms.  You should adjust your dose so that you are taking one and half or two pills or films per day (12-16mg  per day). At this dose you should have cut down on cravings and help with any withdrawal symptoms.   The next day:  In the morning you can take the same amount you took yesterday all at one time in the morning.  Expect a call from your team to see how you are doing.   If you have any questions or concerns at any time call your healthcare team.  Clinic Number:  830am - 5pm: [phone] After Hours Number: [phone] - - Leave your number and expect a call back from a physician.

## 2017-03-28 ENCOUNTER — Encounter: Payer: Self-pay | Admitting: Internal Medicine

## 2017-03-28 DIAGNOSIS — R7689 Other specified abnormal immunological findings in serum: Secondary | ICD-10-CM

## 2017-03-28 DIAGNOSIS — R768 Other specified abnormal immunological findings in serum: Secondary | ICD-10-CM

## 2017-03-28 HISTORY — DX: Other specified abnormal immunological findings in serum: R76.89

## 2017-03-28 HISTORY — DX: Other specified abnormal immunological findings in serum: R76.8

## 2017-03-28 LAB — CMP14 + ANION GAP
ALK PHOS: 94 IU/L (ref 39–117)
ALT: 189 IU/L — ABNORMAL HIGH (ref 0–44)
ANION GAP: 15 mmol/L (ref 10.0–18.0)
AST: 66 IU/L — AB (ref 0–40)
Albumin/Globulin Ratio: 1.8 (ref 1.2–2.2)
Albumin: 4.8 g/dL (ref 3.5–5.5)
BUN/Creatinine Ratio: 10 (ref 9–20)
BUN: 9 mg/dL (ref 6–20)
Bilirubin Total: 0.4 mg/dL (ref 0.0–1.2)
CHLORIDE: 100 mmol/L (ref 96–106)
CO2: 23 mmol/L (ref 20–29)
CREATININE: 0.9 mg/dL (ref 0.76–1.27)
Calcium: 9.7 mg/dL (ref 8.7–10.2)
GFR calc Af Amer: 132 mL/min/{1.73_m2} (ref >59)
GFR calc non Af Amer: 114 mL/min/{1.73_m2} (ref >59)
GLOBULIN, TOTAL: 2.7 g/dL (ref 1.5–4.5)
Glucose: 90 mg/dL (ref 65–99)
Potassium: 4.8 mmol/L (ref 3.5–5.2)
Sodium: 138 mmol/L (ref 134–144)
Total Protein: 7.5 g/dL (ref 6.0–8.5)

## 2017-03-28 LAB — HEPATITIS C ANTIBODY

## 2017-03-28 LAB — HIV ANTIBODY (ROUTINE TESTING W REFLEX): HIV SCREEN 4TH GENERATION: NONREACTIVE

## 2017-03-28 NOTE — Assessment & Plan Note (Signed)
HCV antibody positive, and unfortunately also mild transaminitis. Will plan to check viral load and genotype at next visit to confirm chronic infection. Then we can work on starting treatment, either in the [facility] or with [facility].

## 2017-03-28 NOTE — Progress Notes (Signed)
Internal Medicine Clinic Attending  I saw and evaluated the patient.  I personally confirmed the key portions of the history and exam documented by Dr. Lovenia KimSantos-Sanchez and I reviewed pertinent patient test results.  The assessment, diagnosis, and plan were formulated together and I agree with the documentation in the resident's note.  32 year old person who injects heroin multiple times daily came to our clinic seeking help for opioid use disorder. He reports no significant chronic medical conditions and no significant family history of chronic disease. He says he is seeking help now because he has had several overdoses recently and he thinks that he is going to die soon if he continues to use heroin+fentanyl. The woman that he lives with has been stable on suboxone for many months, and he wants to get his life together. I talked to him about our treatment approach, about the out of pocket cost of buprenorphine-naloxone, the risk of precipitated withdrawal, and our clinics rules and policies. I reviewed the controlled database, we collected a urine tox screen, and we signed a treatment agreement. Unfortunately he is hepatitis c antibody positive which we will have to confirm at next visit and potentially work on treating. Plan is to start generic tab of buprenorphine-naloxone 8mg  bid; we applied for assistance program today. We gave him instructions about home induction. Follow up in one week with me to check on his progress.

## 2017-03-28 NOTE — Addendum Note (Signed)
Addended by: Erlinda HongVINCENT, Izola Teague T on: 03/28/2017 03:44 PM   Modules accepted: Level of Service

## 2017-03-28 NOTE — Addendum Note (Signed)
Addended by: Erlinda HongVINCENT, Julies Carmickle T on: 03/28/2017 03:44 PM   Modules accepted: Orders

## 2017-04-01 ENCOUNTER — Encounter: Payer: Self-pay | Admitting: Pharmacist

## 2017-04-01 NOTE — Progress Notes (Signed)
Patient was approved for Suboxone patient assistance program

## 2017-04-02 LAB — TOXASSURE SELECT,+ANTIDEPR,UR

## 2017-04-03 ENCOUNTER — Telehealth: Payer: Self-pay | Admitting: Student in an Organized Health Care Education/Training Program

## 2017-04-03 NOTE — Telephone Encounter (Signed)
I tried calling Benjamin Hansen to check in after induction with Suboxone. No answer to first number, second number answered by his mother. She brought him in last week an is aware of his diagnosis. She does not know where he is, says she thought he was coming back in today to see us, but he is not on our schedule. I asked her to let him know I called, and he can call us to get worked into our schedule.

## 2017-04-23 NOTE — Telephone Encounter (Signed)
Have tried to reach out to pt, no luck

## 2017-07-20 ENCOUNTER — Encounter (HOSPITAL_COMMUNITY): Payer: Self-pay | Admitting: *Deleted

## 2017-07-20 ENCOUNTER — Emergency Department (HOSPITAL_COMMUNITY)
Admission: EM | Admit: 2017-07-20 | Discharge: 2017-07-20 | Discharge status: Against Medical Advice | Payer: Self-pay | Attending: Emergency Medicine | Admitting: Emergency Medicine

## 2017-07-20 ENCOUNTER — Other Ambulatory Visit: Payer: Self-pay

## 2017-07-20 DIAGNOSIS — F10920 Alcohol use, unspecified with intoxication, uncomplicated: Secondary | ICD-10-CM

## 2017-07-20 DIAGNOSIS — T401X1A Poisoning by heroin, accidental (unintentional), initial encounter: Secondary | ICD-10-CM | POA: Insufficient documentation

## 2017-07-20 DIAGNOSIS — T402X1A Poisoning by other opioids, accidental (unintentional), initial encounter: Secondary | ICD-10-CM

## 2017-07-20 DIAGNOSIS — F17210 Nicotine dependence, cigarettes, uncomplicated: Secondary | ICD-10-CM | POA: Insufficient documentation

## 2017-07-20 LAB — CBC WITH DIFFERENTIAL/PLATELET
BASOS ABS: 0.1 10*3/uL (ref 0.0–0.1)
Basophils Relative: 1 %
Eosinophils Absolute: 0.1 10*3/uL (ref 0.0–0.7)
Eosinophils Relative: 1 %
HEMATOCRIT: 45.8 % (ref 39.0–52.0)
Hemoglobin: 15 g/dL (ref 13.0–17.0)
LYMPHS ABS: 6.2 10*3/uL — AB (ref 0.7–4.0)
LYMPHS PCT: 42 %
MCH: 30.9 pg (ref 26.0–34.0)
MCHC: 32.8 g/dL (ref 30.0–36.0)
MCV: 94.2 fL (ref 78.0–100.0)
MONOS PCT: 5 %
Monocytes Absolute: 0.7 10*3/uL (ref 0.1–1.0)
Neutro Abs: 7.6 10*3/uL (ref 1.7–7.7)
Neutrophils Relative %: 51 %
Platelets: 392 10*3/uL (ref 150–400)
RBC: 4.86 MIL/uL (ref 4.22–5.81)
RDW: 12.4 % (ref 11.5–15.5)
WBC: 14.7 10*3/uL — AB (ref 4.0–10.5)

## 2017-07-20 LAB — COMPREHENSIVE METABOLIC PANEL
ALT: 254 U/L — ABNORMAL HIGH (ref 17–63)
ANION GAP: 15 (ref 5–15)
AST: 100 U/L — ABNORMAL HIGH (ref 15–41)
Albumin: 4.3 g/dL (ref 3.5–5.0)
Alkaline Phosphatase: 88 U/L (ref 38–126)
BILIRUBIN TOTAL: 0.4 mg/dL (ref 0.3–1.2)
BUN: 8 mg/dL (ref 6–20)
CO2: 17 mmol/L — ABNORMAL LOW (ref 22–32)
Calcium: 8.6 mg/dL — ABNORMAL LOW (ref 8.9–10.3)
Chloride: 106 mmol/L (ref 101–111)
Creatinine, Ser: 1.1 mg/dL (ref 0.61–1.24)
GFR calc Af Amer: >60 mL/min (ref >60)
Glucose, Bld: 195 mg/dL — ABNORMAL HIGH (ref 65–99)
POTASSIUM: 3.4 mmol/L — AB (ref 3.5–5.1)
Sodium: 138 mmol/L (ref 135–145)
TOTAL PROTEIN: 7.5 g/dL (ref 6.5–8.1)

## 2017-07-20 LAB — ETHANOL: Alcohol, Ethyl (B): 110 mg/dL — ABNORMAL HIGH (ref <10)

## 2017-07-20 MED ORDER — ONDANSETRON HCL 4 MG/2ML IJ SOLN
INTRAMUSCULAR | Status: AC
  Filled 2017-07-20: qty 2

## 2017-07-20 NOTE — ED Notes (Signed)
The pt is lucid at present after zofran and narcan iv  He reports that he was clean of heroin for 90 days and tonight he had iv heroin after work  He reports he does not remember  What happened

## 2017-07-20 NOTE — ED Triage Notes (Signed)
The pt was brought from a car unresponsive cyanotic pulse present on arrival to the treatment room  zofran 4mg  given iv

## 2017-07-20 NOTE — ED Provider Notes (Signed)
[facility] EMERGENCY DEPARTMENT Provider Note   CSN: [id number] Arrival date & time: 07/20/17  0301     History   Chief Complaint Chief Complaint  Patient presents with  . Altered Mental Status    HPI Benjamin Hansen is a 32 y.o. male.  The history is provided by medical records. The history is limited by the condition of the patient (Unresponsive).  He was brought in by private vehicle and noted to be unresponsive and cyanotic.  He was rushed back to a sustentation room and given naloxone.  Triage RN noted pinpoint pupils.  No other history is available.  No past medical history on file.  There are no active problems to display for this patient.   History reviewed. No pertinent surgical history.      Home Medications    Prior to Admission medications   Not on File    Family History No family history on file.  Social History Social History   Tobacco Use  . Smoking status: Current Every Day Smoker  . Smokeless tobacco: Never Used  Substance Use Topics  . Alcohol use: Not on file  . Drug use: Yes    Types: IV     Allergies   Patient has no allergy information on record.   Review of Systems Review of Systems  Unable to perform ROS: Patient unresponsive     Physical Exam Updated Vital Signs BP 125/73   Pulse (!) 104   Temp (!) 97 F (36.1 C)   Resp 18   Ht 5\' 7"  (1.702 m)   Wt 86.2 kg (190 lb)   SpO2 93%   BMI 29.76 kg/m   Physical Exam  Nursing note and vitals reviewed.  32 year old male, resting comfortably and in no acute distress. Vital signs are significant for mildly elevated heart rate. Oxygen saturation is 93%, which is normal.  He is thrashing about and diaphoretic, but protecting his airway well. Head is normocephalic and atraumatic. PERRLA, EOMI. Oropharynx is clear. Neck is nontender and supple without adenopathy or JVD. Back is nontender and there is no CVA tenderness. Lungs are clear without rales,  wheezes, or rhonchi. Chest is nontender. Heart is tachycardic without murmur. Abdomen is soft, flat, nontender without masses or hepatosplenomegaly and peristalsis is normoactive. Extremities have no cyanosis or edema, full range of motion is present. Skin is warm and dry without rash. Neurologic: Restless and thrashing on the bed but with some purposeful movement, does not respond to verbal commands and he has been nonverbal, cranial nerves are grossly intact, there are no motor deficits.  ED Treatments / Results  Labs (all labs ordered are listed, but only abnormal results are displayed) Labs Reviewed  COMPREHENSIVE METABOLIC PANEL - Abnormal; Notable for the following components:      Result Value   Potassium 3.4 (*)    CO2 17 (*)    Glucose, Bld 195 (*)    Calcium 8.6 (*)    AST 100 (*)    ALT 254 (*)    All other components within normal limits  CBC WITH DIFFERENTIAL/PLATELET - Abnormal; Notable for the following components:   WBC 14.7 (*)    Lymphs Abs 6.2 (*)    All other components within normal limits  ETHANOL - Abnormal; Notable for the following components:   Alcohol, Ethyl (B) 110 (*)    All other components within normal limits  URINALYSIS, ROUTINE W REFLEX MICROSCOPIC  RAPID URINE DRUG SCREEN, HOSP  PERFORMED    EKG EKG Interpretation  Date/Time:  Sunday July 20 2017 03:08:45 EDT Ventricular Rate:  122 PR Interval:    QRS Duration: 86 QT Interval:  320 QTC Calculation: 456 R Axis:   75 Text Interpretation:  Sinus tachycardia Left ventricular hypertrophy Borderline T abnormalities, inferior leads Baseline wander in lead(s) I II aVR aVL aVF V1 V3 V4 V5 V6 No old tracing to compare Confirmed by Dione BoozeGlick, Marsden Zaino ([id number]) on 07/20/2017 3:13:29 AM  Procedures Procedures  CRITICAL CARE Performed by: Dione Boozeavid Myron Stankovich Total critical care time: 85 minutes Critical care time was exclusive of separately billable procedures and treating other patients. Critical care was  necessary to treat or prevent imminent or life-threatening deterioration. Critical care was time spent personally by me on the following activities: development of treatment plan with patient and/or surrogate as well as nursing, discussions with consultants, evaluation of patient's response to treatment, examination of patient, obtaining history from patient or surrogate, ordering and performing treatments and interventions, ordering and review of laboratory studies, ordering and review of radiographic studies, pulse oximetry and re-evaluation of patient's condition.  Medications Ordered in ED Medications  ondansetron (ZOFRAN) 4 MG/2ML injection (has no administration in time range)     Initial Impression / Assessment and Plan / ED Course  I have reviewed the triage vital signs and the nursing notes.  Pertinent labs & imaging results that were available during my care of the patient were reviewed by me and considered in my medical decision making (see chart for details).  Clinical presentation strongly suggestive of opioid overdose-probably fentanyl.  Following administration of naloxone, he is breathing and maintaining good oxygen saturations, but has not regained normal mentation.  Will need to be evaluated for other co-intoxicants.  He will also need to be observed closely for possible recurrence of apnea as naloxone wears off.  3:21 AM Patient now awake and conversant.  He does not recall what happened but thinks he may may have snorted some heroin.  He admits to drinking tonight and states he also uses marijuana, but denies other drug use.  He states he has not injected any heroin in several weeks.  3:55 AM Still awake and alert and conversant.  Friends have arrived to her with him and state that he had not used any drugs while they were at a bar, he went into a bathroom at a gas station, and came back to the car and passed out.  Friend thought he might have overdosed, so she gave him a  Suboxone Filmtab because she knows that it contains some naloxone, and then the drove him to the hospital.   4:27 AM Patient pulled his IV out and left AGAINST MEDICAL ADVICE.  He had left the department before I could be notified.  Final Clinical Impressions(s) / ED Diagnoses   Final diagnoses:  Opioid overdose, accidental or unintentional, initial encounter [facility](HCC)  Alcohol intoxication, uncomplicated [facility](HCC)    ED Discharge Orders    None       Dione BoozeGlick, Joelene Barriere, MD 07/20/17 [phone]

## 2017-07-20 NOTE — ED Notes (Signed)
The pt  Pulled his iv out had a bm in the floor  And walked out   He was seen leaving at the front

## 2017-07-21 ENCOUNTER — Encounter: Payer: Self-pay | Admitting: Internal Medicine

## 2018-11-26 ENCOUNTER — Other Ambulatory Visit: Payer: Self-pay

## 2018-11-26 ENCOUNTER — Emergency Department (HOSPITAL_COMMUNITY): Payer: Self-pay

## 2018-11-26 ENCOUNTER — Encounter (HOSPITAL_COMMUNITY): Payer: Self-pay | Admitting: Emergency Medicine

## 2018-11-26 ENCOUNTER — Emergency Department (HOSPITAL_COMMUNITY)
Admission: EM | Admit: 2018-11-26 | Discharge: 2018-11-26 | Disposition: A | Discharge status: Home / Self Care | Payer: Self-pay | Attending: Emergency Medicine | Admitting: Emergency Medicine

## 2018-11-26 DIAGNOSIS — Z20822 Contact with and (suspected) exposure to covid-19: Secondary | ICD-10-CM

## 2018-11-26 DIAGNOSIS — F17210 Nicotine dependence, cigarettes, uncomplicated: Secondary | ICD-10-CM | POA: Insufficient documentation

## 2018-11-26 DIAGNOSIS — J189 Pneumonia, unspecified organism: Secondary | ICD-10-CM | POA: Insufficient documentation

## 2018-11-26 DIAGNOSIS — Z20828 Contact with and (suspected) exposure to other viral communicable diseases: Secondary | ICD-10-CM | POA: Insufficient documentation

## 2018-11-26 DIAGNOSIS — Z79899 Other long term (current) drug therapy: Secondary | ICD-10-CM | POA: Insufficient documentation

## 2018-11-26 LAB — COMPREHENSIVE METABOLIC PANEL
ALT: 220 U/L — ABNORMAL HIGH (ref 0–44)
AST: 82 U/L — ABNORMAL HIGH (ref 15–41)
Albumin: 4.3 g/dL (ref 3.5–5.0)
Alkaline Phosphatase: 95 U/L (ref 38–126)
Anion gap: 11 (ref 5–15)
BUN: 12 mg/dL (ref 6–20)
CO2: 22 mmol/L (ref 22–32)
Calcium: 9.2 mg/dL (ref 8.9–10.3)
Chloride: 105 mmol/L (ref 98–111)
Creatinine, Ser: 0.91 mg/dL (ref 0.61–1.24)
GFR calc Af Amer: >60 mL/min (ref >60)
GFR calc non Af Amer: >60 mL/min (ref >60)
Glucose, Bld: 119 mg/dL — ABNORMAL HIGH (ref 70–99)
Potassium: 3.4 mmol/L — ABNORMAL LOW (ref 3.5–5.1)
Sodium: 138 mmol/L (ref 135–145)
Total Bilirubin: 0.5 mg/dL (ref 0.3–1.2)
Total Protein: 7.6 g/dL (ref 6.5–8.1)

## 2018-11-26 LAB — CBC
HCT: 47.4 % (ref 39.0–52.0)
Hemoglobin: 15.9 g/dL (ref 13.0–17.0)
MCH: 30.6 pg (ref 26.0–34.0)
MCHC: 33.5 g/dL (ref 30.0–36.0)
MCV: 91.2 fL (ref 80.0–100.0)
Platelets: 290 10*3/uL (ref 150–400)
RBC: 5.2 MIL/uL (ref 4.22–5.81)
RDW: 11.9 % (ref 11.5–15.5)
WBC: 9.5 10*3/uL (ref 4.0–10.5)
nRBC: 0 % (ref 0.0–0.2)

## 2018-11-26 LAB — LIPASE, BLOOD: Lipase: 27 U/L (ref 11–51)

## 2018-11-26 LAB — URINALYSIS, ROUTINE W REFLEX MICROSCOPIC
Bilirubin Urine: NEGATIVE
Glucose, UA: NEGATIVE mg/dL
Hgb urine dipstick: NEGATIVE
Ketones, ur: 5 mg/dL — AB
Leukocytes,Ua: NEGATIVE
Nitrite: NEGATIVE
Protein, ur: NEGATIVE mg/dL
Specific Gravity, Urine: 1.029 (ref 1.005–1.030)
pH: 5 (ref 5.0–8.0)

## 2018-11-26 MED ORDER — SODIUM CHLORIDE 0.9 % IV BOLUS
1000.0000 mL | Freq: Once | INTRAVENOUS | Status: AC
  Administered 2018-11-26: 12:00:00 1000 mL via INTRAVENOUS

## 2018-11-26 MED ORDER — IOHEXOL 300 MG/ML  SOLN
100.0000 mL | Freq: Once | INTRAMUSCULAR | Status: AC | PRN
  Administered 2018-11-26: 100 mL via INTRAVENOUS

## 2018-11-26 MED ORDER — KETOROLAC TROMETHAMINE 30 MG/ML IJ SOLN
30.0000 mg | Freq: Once | INTRAMUSCULAR | Status: AC
  Administered 2018-11-26: 15:00:00 30 mg via INTRAVENOUS
  Filled 2018-11-26: qty 1

## 2018-11-26 MED ORDER — DOXYCYCLINE HYCLATE 100 MG PO TABS: 100.0000 mg | ORAL_TABLET | Freq: Once | ORAL | Status: DC

## 2018-11-26 MED ORDER — ONDANSETRON HCL 4 MG/2ML IJ SOLN
4.0000 mg | Freq: Once | INTRAMUSCULAR | Status: AC
  Administered 2018-11-26: 4 mg via INTRAVENOUS
  Filled 2018-11-26: qty 2

## 2018-11-26 MED ORDER — DOXYCYCLINE HYCLATE 100 MG PO CAPS: 100.0000 mg | ORAL_CAPSULE | Freq: Two times a day (BID) | ORAL | 0 refills | Status: AC

## 2018-11-26 NOTE — Discharge Instructions (Addendum)
You have been diagnosed today with suspected COVID-19 viral infection, pneumonia.  At this time there does not appear to be the presence of an emergent medical condition, however there is always the potential for conditions to change. Please read and follow the below instructions.  Please return to the Emergency Department immediately for any new or worsening symptoms. Please be sure to follow up with your Primary Care Provider within one week regarding your visit today; please call their office to schedule an appointment even if you are feeling better for a follow-up visit. Please take the antibiotic doxycycline as prescribed for treatment of any early developing bacterial pneumonia. Please drink plenty water and get plenty of rest to help with your symptoms.  Please call your primary care doctor's office today to schedule a follow-up appointment via tele-visit this week for recheck.  Return to the emergency department immediately for any new or worsening of symptoms.  Please wear a mask to avoid potential spread of any virus. Check your MyChart account in the next 1-2 days for results of your COVID-19 test.  If your COVID-19 test is positive please continue self quarantining for at least 2 weeks and symptom-free x7 days.  If your COVID-19 test is negative I still strongly encouraged that you self quarantine for 2 weeks and symptom-free for 7 days as there is a possibility of a false negative test. It appears that you have a small cyst on your right kidney, please discuss this incidental finding with your primary care provider at your next visit this week.  Get help right away if: You are short of breath You have more chest pain. Your sickness gets worse. This is very serious if: You are an older adult. Your body's defense system is weak. You cough up blood. You have any new/concerning or worsening of symptoms  Please read the additional information packets attached to your discharge  summary.  Do not take your medicine if  develop an itchy rash, swelling in your mouth or lips, or difficulty breathing; call 911 and seek immediate emergency medical attention if this occurs.  Note: Portions of this text may have been transcribed using voice recognition software. Every effort was made to ensure accuracy; however, inadvertent computerized transcription errors may still be present.

## 2018-11-26 NOTE — ED Provider Notes (Signed)
[facility] EMERGENCY DEPARTMENT Provider Note   CSN: [id number] Arrival date & time: 11/26/18  0383     History   Chief Complaint Chief Complaint  Patient presents with  . Fatigue  . Chills  . Nausea    HPI Benjamin Hansen is a 33 y.o. male has no history of substance abuse currently on Suboxone, hepatitis C who presents for evaluation of abdominal pain, nausea/vomiting, decreased appetite that is been ongoing for the last 4 days.  Patient states that he started noticing in the mornings, he would wake up and have some periumbilical abdominal pain and some nausea/vomiting.  He reports a couple episodes of emesis that was green in color.  No blood noted in emesis.  He states that after a few hours, this pain with slightly resolved but he states that he has had decreased appetite and has not felt like eating.  He states that he has not noted any fevers but has had some chills.  He went to his doctor for evaluation of symptoms.  He states that he is currently on Suboxone and states that because of his symptoms, he was sleeping later than normal.  He was worried that he might be withdrawing from his Suboxone use.  His PCP increased his dose of Suboxone by 4 mg yesterday but he states that despite that, this morning, he woke up with similar symptoms.     The history is provided by the patient.    Past Medical History:  Diagnosis Date  . Hepatitis C antibody test positive 03/28/2017  . Opioid use disorder ([place]) 03/27/2017    Patient Active Problem List   Diagnosis Date Noted  . Hepatitis C antibody test positive 03/28/2017  . Opioid use disorder ([place]) 03/27/2017    History reviewed. No pertinent surgical history.      Home Medications    Prior to Admission medications   Medication Sig Start Date End Date Taking? Authorizing Provider  buprenorphine-naloxone (SUBOXONE) 8-2 mg SUBL SL tablet Place 1 tablet under the tongue 2 (two) times daily. 03/27/17   Yes Axel Filler, MD  Multiple Vitamin (MULTIVITAMIN WITH MINERALS) TABS tablet Take 1 tablet by mouth daily.   Yes [provider]    Family History No family history on file.  Social History Social History   Tobacco Use  . Smoking status: Current Every Day Smoker    Packs/day: 1.00    Types: Cigarettes  . Smokeless tobacco: Never Used  Substance Use Topics  . Alcohol use: No  . Drug use: Yes    Types: Cocaine, IV, Marijuana    Comment: Last used IV drugs in June, now going through detox     Allergies   Patient has no known allergies.   Review of Systems Review of Systems  Constitutional: Positive for appetite change. Negative for fever.  Respiratory: Negative for cough and shortness of breath.   Cardiovascular: Negative for chest pain.  Gastrointestinal: Positive for abdominal pain, nausea and vomiting.  Genitourinary: Negative for dysuria and hematuria.  Neurological: Negative for headaches.  All other systems reviewed and are negative.    Physical Exam Updated Vital Signs BP 139/83   Pulse (!) 42   Temp 99.2 F (37.3 C) (Oral)   Resp 15   Ht '5\' 10"'  (1.778 m)   Wt 84.8 kg   SpO2 99%   BMI 26.83 kg/m   Physical Exam Vitals signs and nursing note reviewed.  Constitutional:  Appearance: Normal appearance. He is well-developed.  HENT:     Head: Normocephalic and atraumatic.  Eyes:     General: Lids are normal.     Conjunctiva/sclera: Conjunctivae normal.     Pupils: Pupils are equal, round, and reactive to light.  Neck:     Musculoskeletal: Full passive range of motion without pain.  Cardiovascular:     Rate and Rhythm: Normal rate and regular rhythm.     Pulses: Normal pulses.     Heart sounds: Normal heart sounds. No murmur. No friction rub. No gallop.   Pulmonary:     Effort: Pulmonary effort is normal.     Breath sounds: Normal breath sounds.     Comments: Lungs clear to auscultation bilaterally.  Symmetric chest rise.   No wheezing, rales, rhonchi. Abdominal:     Palpations: Abdomen is soft. Abdomen is not rigid.     Tenderness: There is abdominal tenderness in the right lower quadrant and periumbilical area. There is no right CVA tenderness, left CVA tenderness or guarding.     Comments: Abdomen is soft, nondistended.  Mild tenderness palpation in periumbilical right lower quadrant.  No rigidity, guarding.  No CVA tenderness noted bilaterally.  Musculoskeletal: Normal range of motion.  Skin:    General: Skin is warm and dry.     Capillary Refill: Capillary refill takes less than 2 seconds.  Neurological:     Mental Status: He is alert and oriented to person, place, and time.  Psychiatric:        Speech: Speech normal.      ED Treatments / Results  Labs (all labs ordered are listed, but only abnormal results are displayed) Labs Reviewed  COMPREHENSIVE METABOLIC PANEL - Abnormal; Notable for the following components:      Result Value   Potassium 3.4 (*)    Glucose, Bld 119 (*)    AST 82 (*)    ALT 220 (*)    All other components within normal limits  URINALYSIS, ROUTINE W REFLEX MICROSCOPIC - Abnormal; Notable for the following components:   APPearance HAZY (*)    Ketones, ur 5 (*)    All other components within normal limits  LIPASE, BLOOD  CBC    EKG None  Radiology No results found.  Procedures Procedures (including critical care time)  Medications Ordered in ED Medications  sodium chloride 0.9 % bolus 1,000 mL (1,000 mLs Intravenous New Bag/Given 11/26/18 1229)  ondansetron (ZOFRAN) injection 4 mg (4 mg Intravenous Given 11/26/18 1227)  ketorolac (TORADOL) 30 MG/ML injection 30 mg (30 mg Intravenous Given 11/26/18 1444)  iohexol (OMNIPAQUE) 300 MG/ML solution 100 mL (100 mLs Intravenous Contrast Given 11/26/18 1551)     Initial Impression / Assessment and Plan / ED Course  I have reviewed the triage vital signs and the nursing notes.  Pertinent labs & imaging results that  were available during my care of the patient were reviewed by me and considered in my medical decision making (see chart for details).  Clinical Course as of Nov 26 1651  Thu Nov 26, 2018  1634 Plan: Follow CT Abd/Pelvis.   [BM]    Clinical Course User Index [BM] Deliah Boston, PA-C       33 year old male who presents for evaluation of abdominal pain, nausea/vomiting, decreased appetite x4 days.  States that it was initially in the morning and he had some green emesis.  He states that after a few hours, the vomiting and abdominal pain would improve  but states that he continued to have decreased appetite.  Saw PCP who upped his Suboxone with no improvement in symptoms.  No fevers but has had some chills.  Initially arrival, he is afebrile, nontoxic-appearing, sitting comfortably on bed.  Vital signs are stable.  On exam, he does have some tenderness noted periumbilical and right lower quadrant.  Question appendicitis versus infectious etiology.  History/physical exam concerning for kidney stone.  Doubt UTI.  Plan for labs.  CMP shows potassium of 3.4.  AST is slightly elevated at 82, ALT of 220. Patient with no RUQ tenderness.  Alk phos and bili are within normal limits.  Lipase is normal.  CBC without any significant leukocytosis or anemia.  UA shows no evidence of infectious etiology.  I did review his records.  He has had a history of transaminitis before this is actually proved from his previous labs about 5 months ago.  Patient signed out to Nuala Alpha, PA-C with CT abd/pelvis pending.   Portions of this note were generated with Lobby[employer]. Dictation errors may occur despite best attempts at proofreading.   Final Clinical Impressions(s) / ED Diagnoses   Final diagnoses:  Nausea and vomiting, intractability of vomiting not specified, unspecified vomiting type  Periumbilical abdominal pain    ED Discharge Orders    None       Volanda Napoleon, PA-C  11/26/18 1653    Milton Ferguson, MD 11/27/18 [phone]

## 2018-11-26 NOTE — ED Triage Notes (Signed)
Pt sitting up in bed having dry heaves and sweating . Pt informed.

## 2018-11-26 NOTE — ED Provider Notes (Addendum)
Care handoff received from Providence Lanius, PA-C at shift change, please see her note for further details.  In short 33 year old male with history of substance abuse, on Suboxone, hepatitis C presents today for abdominal pain nausea vomiting and decreased appetite x4 days.  Periumbilic and right lower quadrant tenderness noted on examination.  No RUQ tenderness.  CMP with elevation of LFTs, near baseline. UA nonacute, appears slightly dehydrated, fluid bolus given CBC within normal limits Lipase within normal limits  Plan of care is to follow-up on CT abdomen and disposition accordingly. Physical Exam  BP 139/83   Pulse (!) 42   Temp 99.2 F (37.3 C) (Oral)   Resp 15   Ht 5\' 10"  (1.778 m)   Wt 84.8 kg   SpO2 99%   BMI 26.83 kg/m   Physical Exam Constitutional:      General: He is not in acute distress.    Appearance: Normal appearance. He is well-developed. He is not ill-appearing or diaphoretic.  HENT:     Head: Normocephalic and atraumatic.     Right Ear: External ear normal.     Left Ear: External ear normal.     Nose: Nose normal.  Eyes:     General: Vision grossly intact. Gaze aligned appropriately.     Pupils: Pupils are equal, round, and reactive to light.  Neck:     Musculoskeletal: Normal range of motion.     Trachea: Trachea and phonation normal. No tracheal deviation.  Pulmonary:     Effort: Pulmonary effort is normal. No respiratory distress.  Abdominal:     General: There is no distension.     Palpations: Abdomen is soft.     Tenderness: There is no abdominal tenderness. There is no guarding or rebound.  Musculoskeletal: Normal range of motion.  Skin:    General: Skin is warm and dry.  Neurological:     Mental Status: He is alert.     GCS: GCS eye subscore is 4. GCS verbal subscore is 5. GCS motor subscore is 6.     Comments: Speech is clear and goal oriented, follows commands Major Cranial nerves without deficit, no facial droop Moves extremities  without ataxia, coordination intact  Psychiatric:        Behavior: Behavior normal.     ED Course/Procedures   Clinical Course as of Nov 25 1717  Thu Nov 26, 2018  1634 Plan: Follow CT Abd/Pelvis.   [BM]    Clinical Course User Index [BM] Deliah Boston, PA-C    Procedures  MDM   CTAP:  IMPRESSION:  1. Faint multifocal bilateral lower low lobe pulmonary ground-glass  opacity, which is nonspecific but can be seen with acute atypical  infection (as well as other non-infectious etiologies). In  particular, viral pneumonia (including COVID-19) should be  considered in the appropriate clinical setting.    2. Normal appendix. And no acute or inflammatory process identified  in the abdomen or pelvis.  - Patient reassessed resting comfortably and in no acute distress requesting discharge.  On reexamination he is overall well-appearing, no tenderness of the abdomen after Toradol today.  He reports he is feeling improved and would like to go home.  He has been updated on CT scan results as above and states understanding.  He has no respiratory complaints, vital signs stable and no tachycardia or hypoxia on room air.  There is no indication for admission. He will be tested for COVID-19 virus today he is encouraged to check his  MyChart account for results in the next 1-2 days.  He is informed that despite test results he needs to self quarantine until he is symptom-free for at least 7 days to avoid potential spread of any virus.  He will be started on doxycycline 100 mg twice daily in case development of bacterial pneumonia.  He will follow-up with his PCP via tele-visit this week for recheck.  He is also encouraged to discuss Suboxone dosage and with his PCP, he has been encouraged to drink plenty of water and get plenty of rest.  At this time there does not appear to be any evidence of an acute emergency medical condition and the patient appears stable for discharge with appropriate  outpatient follow up. Diagnosis was discussed with patient who verbalizes understanding of care plan and is agreeable to discharge. I have discussed return precautions with patient who verbalizes understanding of return precautions. Patient encouraged to follow-up with their PCP. All questions answered.  Patient has been discharged in good condition.  Patient's case discussed with Dr. Particia Nearing who agrees with plan to discharge with Doxycycline and PCP follow-up.   Note: Portions of this report may have been transcribed using voice recognition software. Every effort was made to ensure accuracy; however, inadvertent computerized transcription errors may still be present.   Bill Salinas, PA-C 11/26/18 1743    Bill Salinas, PA-C 11/26/18 1744    Jacalyn Lefevre, MD 11/26/18 1747

## 2018-11-26 NOTE — ED Notes (Signed)
Patient verbalizes understanding of discharge instructions. Opportunity for questioning and answers were provided. Armband removed by staff, pt discharged from ED. Ambulated out to lobby  

## 2018-11-26 NOTE — ED Triage Notes (Signed)
Pt reports fatigue, chills, and nausea for the past 4 days. Pt reports these symptoms are only in the morning and resolve themselves after being awake for an hour. Pt reports going to his PCP yesterday and reporting they upped his suboxone by 4 mg due to possible withdrawals. Pt here for concerns over coronavirus or withdrawals because he is still feeling bad in the mornings.

## 2018-11-27 LAB — SARS CORONAVIRUS 2 (TAT 6-24 HRS): SARS Coronavirus 2: NEGATIVE

## 2018-12-07 ENCOUNTER — Other Ambulatory Visit: Payer: Self-pay

## 2018-12-07 ENCOUNTER — Emergency Department (HOSPITAL_COMMUNITY): Payer: Self-pay

## 2018-12-07 ENCOUNTER — Emergency Department (HOSPITAL_COMMUNITY)
Admission: EM | Admit: 2018-12-07 | Discharge: 2018-12-07 | Disposition: A | Discharge status: Home / Self Care | Payer: Self-pay | Attending: Emergency Medicine | Admitting: Emergency Medicine

## 2018-12-07 ENCOUNTER — Encounter (HOSPITAL_COMMUNITY): Payer: Self-pay | Admitting: Emergency Medicine

## 2018-12-07 DIAGNOSIS — F17210 Nicotine dependence, cigarettes, uncomplicated: Secondary | ICD-10-CM | POA: Insufficient documentation

## 2018-12-07 DIAGNOSIS — R5381 Other malaise: Secondary | ICD-10-CM | POA: Insufficient documentation

## 2018-12-07 DIAGNOSIS — Z79899 Other long term (current) drug therapy: Secondary | ICD-10-CM | POA: Insufficient documentation

## 2018-12-07 DIAGNOSIS — R5383 Other fatigue: Secondary | ICD-10-CM | POA: Insufficient documentation

## 2018-12-07 LAB — CBC
HCT: 46.9 % (ref 39.0–52.0)
Hemoglobin: 15.6 g/dL (ref 13.0–17.0)
MCH: 30.2 pg (ref 26.0–34.0)
MCHC: 33.3 g/dL (ref 30.0–36.0)
MCV: 90.9 fL (ref 80.0–100.0)
Platelets: 277 10*3/uL (ref 150–400)
RBC: 5.16 MIL/uL (ref 4.22–5.81)
RDW: 11.9 % (ref 11.5–15.5)
WBC: 11.9 10*3/uL — ABNORMAL HIGH (ref 4.0–10.5)
nRBC: 0 % (ref 0.0–0.2)

## 2018-12-07 LAB — COMPREHENSIVE METABOLIC PANEL
ALT: 130 U/L — ABNORMAL HIGH (ref 0–44)
AST: 49 U/L — ABNORMAL HIGH (ref 15–41)
Albumin: 4.1 g/dL (ref 3.5–5.0)
Alkaline Phosphatase: 74 U/L (ref 38–126)
Anion gap: 8 (ref 5–15)
BUN: 8 mg/dL (ref 6–20)
CO2: 24 mmol/L (ref 22–32)
Calcium: 9.4 mg/dL (ref 8.9–10.3)
Chloride: 106 mmol/L (ref 98–111)
Creatinine, Ser: 0.93 mg/dL (ref 0.61–1.24)
GFR calc Af Amer: >60 mL/min (ref >60)
GFR calc non Af Amer: >60 mL/min (ref >60)
Glucose, Bld: 119 mg/dL — ABNORMAL HIGH (ref 70–99)
Potassium: 3.5 mmol/L (ref 3.5–5.1)
Sodium: 138 mmol/L (ref 135–145)
Total Bilirubin: 1.1 mg/dL (ref 0.3–1.2)
Total Protein: 6.9 g/dL (ref 6.5–8.1)

## 2018-12-07 LAB — URINALYSIS, ROUTINE W REFLEX MICROSCOPIC
Bilirubin Urine: NEGATIVE
Glucose, UA: NEGATIVE mg/dL
Hgb urine dipstick: NEGATIVE
Ketones, ur: NEGATIVE mg/dL
Leukocytes,Ua: NEGATIVE
Nitrite: NEGATIVE
Protein, ur: NEGATIVE mg/dL
Specific Gravity, Urine: 1.016 (ref 1.005–1.030)
pH: 8 (ref 5.0–8.0)

## 2018-12-07 LAB — LIPASE, BLOOD: Lipase: 39 U/L (ref 11–51)

## 2018-12-07 NOTE — Discharge Instructions (Signed)
It was our pleasure to provide your ER care today - we hope that you feel better.  Follow up with primary care doctor in the next 1-2 weeks - discuss further evaluation of current symptoms, as well as follow up with them regarding the chronic elevation of your liver function tests and prior hepatitis C test result.   Return to ER if worse, new symptoms, fevers, chest pain, trouble breathing, persistent vomiting, or other concern.

## 2018-12-07 NOTE — ED Provider Notes (Signed)
[facility]   CSN: [id number] Arrival date & time: 12/07/18  0741     History   Chief Complaint Chief Complaint  Patient presents with   Abdominal Pain    HPI Chestine SporeJoshua Aaron Fizer is a 33 y.o. male.     Patient c/o continuing to generally not feel well when he wakes up in the morning for the past few weeks. States sometimes it lasts minutes, other times a couple hours. Notes general malaise/fatigue, occasional chills, and dry heaves. Symptoms occur at rest, no relation to activity, position, or exertion. States the rests of the day will feel fine. Notes occasional pain w dry heaves in xiphoids area, mild, dull, non radiating. No exertional cp or discomfort. No sob. No cough or uri symptoms. No abd distension. Having normal bms. No fever. No wt loss. No palpitations. No leg pain or swelling. No rash/skin lesions. Was recently found to have possible pna on eval of these symptoms w ct abd/pelvis - states completed abx but symptoms no different.   The history is provided by the patient.  Abdominal Pain Associated symptoms: chills, fatigue and nausea   Associated symptoms: no chest pain, no cough, no diarrhea, no dysuria, no fever, no shortness of breath and no sore throat     Past Medical History:  Diagnosis Date   Hepatitis C antibody test positive 03/28/2017   Opioid use disorder (HCC) 03/27/2017    Patient Active Problem List   Diagnosis Date Noted   Hepatitis C antibody test positive 03/28/2017   Opioid use disorder (HCC) 03/27/2017    History reviewed. No pertinent surgical history.      Home Medications    Prior to Admission medications   Medication Sig Start Date End Date Taking? Authorizing Provider  buprenorphine-naloxone (SUBOXONE) 8-2 mg SUBL SL tablet Place 1 tablet under the tongue 2 (two) times daily. 03/27/17   Tyson AliasVincent, Duncan Thomas, MD  doxycycline (VIBRAMYCIN) 100 MG capsule Take 1 capsule (100 mg  total) by mouth 2 (two) times daily. 11/26/18   Harlene SaltsMorelli, Brandon A, PA-C  Multiple Vitamin (MULTIVITAMIN WITH MINERALS) TABS tablet Take 1 tablet by mouth daily.    [provider]    Family History No family history on file.  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   Review of Systems Review of Systems  Constitutional: Positive for chills and fatigue. Negative for diaphoresis, fever and unexpected weight change.  HENT: Negative for sore throat.   Eyes: Negative for redness and visual disturbance.  Respiratory: Negative for cough and shortness of breath.   Cardiovascular: Negative for chest pain, palpitations and leg swelling.  Gastrointestinal: Positive for abdominal pain and nausea. Negative for diarrhea.  Endocrine: Negative for polyuria.  Genitourinary: Negative for dysuria and flank pain.  Musculoskeletal: Negative for back pain and neck pain.  Skin: Negative for rash.  Neurological: Negative for speech difficulty, numbness and headaches.  Hematological: Does not bruise/bleed easily.  Psychiatric/Behavioral: Negative for confusion.     Physical Exam Updated Vital Signs BP 127/68    Pulse  67    Temp 98.7 F (37.1 C) (Oral)    Resp 14    SpO2 97%   Physical Exam Vitals signs and nursing note reviewed.  Constitutional:      Appearance: Normal appearance. He is well-developed.  HENT:     Head: Atraumatic.     Nose: Nose normal.     Mouth/Throat:     Mouth: Mucous membranes are moist.     Pharynx: Oropharynx is clear. No oropharyngeal exudate or posterior oropharyngeal erythema.  Eyes:     General: No scleral icterus.     Conjunctiva/sclera: Conjunctivae normal.     Pupils: Pupils are equal, round, and reactive to light.  Neck:     Musculoskeletal: Normal range of motion and neck supple. No neck rigidity.     Trachea: No tracheal deviation.     Comments: Thyroid not grossly enlarged or tender.  Cardiovascular:     Rate and Rhythm: Normal rate and regular rhythm.     Pulses: Normal pulses.     Heart sounds: Normal heart sounds. No murmur. No friction rub. No gallop.   Pulmonary:     Effort: Pulmonary effort is normal. No accessory muscle usage or respiratory distress.     Breath sounds: Normal breath sounds.  Abdominal:     General: Bowel sounds are normal. There is no distension.     Palpations: Abdomen is soft. There is no mass.     Tenderness: There is no abdominal tenderness. There is no guarding.  Genitourinary:    Comments: No cva tenderness. Musculoskeletal:        General: No swelling.     Right lower leg: No edema.     Left lower leg: No edema.  Lymphadenopathy:     Cervical: No cervical adenopathy.  Skin:    General: Skin is warm and dry.     Findings: No rash.  Neurological:     Mental Status: He is alert.     Comments: Alert, speech clear. Steady gait.   Psychiatric:        Mood and Affect: Mood normal.      ED Treatments / Results  Labs (all labs ordered are listed, but only abnormal results are displayed) Results for orders placed or performed during the hospital encounter of 12/07/18  Lipase, blood  Result Value Ref Range   Lipase 39 11 - 51 U/L  Comprehensive metabolic panel  Result Value Ref Range   Sodium 138 135 - 145 mmol/L   Potassium 3.5 3.5 - 5.1 mmol/L   Chloride 106 98 - 111 mmol/L   CO2 24 22 - 32 mmol/L   Glucose, Bld 119 (H) 70 - 99 mg/dL   BUN 8 6 - 20 mg/dL   Creatinine, Ser 1.61 0.61 - 1.24 mg/dL   Calcium 9.4 8.9 - 09.6 mg/dL   Total Protein 6.9 6.5 - 8.1 g/dL   Albumin 4.1 3.5 - 5.0 g/dL   AST 49 (H) 15 - 41 U/L   ALT 130 (H) 0 - 44 U/L    Alkaline Phosphatase 74 38 - 126 U/L   Total Bilirubin 1.1 0.3 - 1.2 mg/dL   GFR calc non Af Amer >60 >60 mL/min   GFR calc Af Amer >60 >60 mL/min   Anion gap 8 5 - 15  CBC  Result Value Ref Range   WBC 11.9 (H) 4.0 - 10.5 K/uL   RBC 5.16 4.22 - 5.81 MIL/uL   Hemoglobin 15.6 13.0 - 17.0 g/dL  HCT 46.9 39.0 - 52.0 %   MCV 90.9 80.0 - 100.0 fL   MCH 30.2 26.0 - 34.0 pg   MCHC 33.3 30.0 - 36.0 g/dL   RDW 11.9 11.5 - 15.5 %   Platelets 277 150 - 400 K/uL   nRBC 0.0 0.0 - 0.2 %  Urinalysis, Routine w reflex microscopic  Result Value Ref Range   Color, Urine YELLOW YELLOW   APPearance CLEAR CLEAR   Specific Gravity, Urine 1.016 1.005 - 1.030   pH 8.0 5.0 - 8.0   Glucose, UA NEGATIVE NEGATIVE mg/dL   Hgb urine dipstick NEGATIVE NEGATIVE   Bilirubin Urine NEGATIVE NEGATIVE   Ketones, ur NEGATIVE NEGATIVE mg/dL   Protein, ur NEGATIVE NEGATIVE mg/dL   Nitrite NEGATIVE NEGATIVE   Leukocytes,Ua NEGATIVE NEGATIVE   Dg Chest 2 View  Result Date: 12/07/2018 CLINICAL DATA:  Follow-up pneumonia, abdominal pain. EXAM: CHEST - 2 VIEW COMPARISON:  11/09/2016 FINDINGS: The heart size and mediastinal contours are within normal limits. Both lungs are clear. The visualized skeletal structures are unremarkable. IMPRESSION: No active cardiopulmonary disease. Electronically Signed   By: Zetta Bills M.D.   On: 12/07/2018 08:24   Ct Abdomen Pelvis W Contrast  Result Date: 11/26/2018 CLINICAL DATA:  33 year old male with 4 days of right lower quadrant abdominal pain, nausea vomiting. EXAM: CT ABDOMEN AND PELVIS WITH CONTRAST TECHNIQUE: Multidetector CT imaging of the abdomen and pelvis was performed using the standard protocol following bolus administration of intravenous contrast. CONTRAST:  135mL OMNIPAQUE IOHEXOL 300 MG/ML  SOLN COMPARISON:  None. FINDINGS: Lower chest: Mild nonspecific multifocal pulmonary ground-glass opacity in the right lower lobe (series 5, images 21 through 32). Negative visible  middle lobes. But there is some mild left lower lobe involvement (images 1 and 28). No pleural effusion. Cardiac size at the upper limits of normal. No pericardial effusion. Hepatobiliary: Negative liver and gallbladder. No bile duct enlargement. Pancreas: Negative. Spleen: Negative. Adrenals/Urinary Tract: Normal adrenal glands. Small benign appearing right renal upper pole cyst, simple fluid density. Normal kidneys with no hydronephrosis or perinephric stranding. No nephrolithiasis identified. Decompressed and normal ureters. Decompressed and unremarkable urinary bladder. Stomach/Bowel: The descending and rectosigmoid colon segments appear normal. Negative transverse colon. Negative right colon. Normal appendix identified on coronal image 50. Negative terminal ileum. Mildly fluid-filled but nondilated small bowel. Negative stomach and duodenum. No free air, free fluid. Vascular/Lymphatic: Major arterial structures are patent and appear normal. Portal venous system is patent. Reproductive: Negative. Other: No pelvic free fluid. Musculoskeletal: Negative. IMPRESSION: 1. Faint multifocal bilateral lower low lobe pulmonary ground-glass opacity, which is nonspecific but can be seen with acute atypical infection (as well as other non-infectious etiologies). In particular, viral pneumonia (including COVID-19) should be considered in the appropriate clinical setting. 2. Normal appendix. And no acute or inflammatory process identified in the abdomen or pelvis. Electronically Signed   By: Genevie Ann M.D.   On: 11/26/2018 16:58    EKG EKG Interpretation  Date/Time:  Monday December 07 2018 08:47:45 EDT Ventricular Rate:  61 PR Interval:    QRS Duration: 97 QT Interval:  414 QTC Calculation: 417 R Axis:   68 Text Interpretation:  Sinus arrhythmia `no acute st/t changes Confirmed by Lajean Saver [phone]) on 12/07/2018 8:54:21 AM   Radiology Dg Chest 2 View  Result Date: 12/07/2018 CLINICAL DATA:  Follow-up  pneumonia, abdominal pain. EXAM: CHEST - 2 VIEW COMPARISON:  11/09/2016 FINDINGS: The heart size and mediastinal contours are within normal limits. Both lungs  are clear. The visualized skeletal structures are unremarkable. IMPRESSION: No active cardiopulmonary disease. Electronically Signed   By: Donzetta Kohut M.D.   On: 12/07/2018 08:24    Procedures Procedures (including critical care time)  Medications Ordered in ED Medications - No data to display   Initial Impression / Assessment and Plan / ED Course  I have reviewed the triage vital signs and the nursing notes.  Pertinent labs & imaging results that were available during my care of the patient were reviewed by me and considered in my medical decision making (see chart for details).  Labs sent. Cxr.   Reviewed nursing notes and prior charts for additional history. Recent ct for same symptoms reviewed - abd/pelvis neg. Possible pna.   Labs reviewed by me - chem normal. Mild elev ast/alt unchanged from prior.   CXR reviewed by me - no pna.  Recheck pt, normal vital signs, pulse ox 99% room air, no increased wob. No current abd pain, no chest pain.   Regarding prior Hep c antibody positive - pt aware, discussed need pcp/gi follow up, including for persistent elev lfts, hep c - pt voices understanding. Also discussed diff dx, viral syndrome, hep, withdrawal, suboxone therapy side effect, undiagnosed other condition, etc.  Discussed need for pcp f/u w pt.   Patient currently is asymptomatic, and appears stable for d/c.      Final Clinical Impressions(s) / ED Diagnoses   Final diagnoses:  None    ED Discharge Orders    None       Cathren Laine, MD 12/07/18 1015

## 2018-12-07 NOTE — ED Triage Notes (Addendum)
Patient presents ambulatory stating he was here about 2 weeks ago and diagnosed with pneumonia sent home on antibiotics. Patient c/o abdominal pain and general feeling of unwell with sharp abdominal pain. One episode of emesis this am.

## 2020-10-28 IMAGING — CT CT ABD-PELV W/ CM
2 of 4 series · 16 of 46 positions shown, 18 images · IV contrast (APPLIED)
Comparison: None.

CLINICAL DATA: 33-year-old male with 4 days of right lower quadrant
abdominal pain, nausea vomiting.

EXAM:
CT ABDOMEN AND PELVIS WITH CONTRAST
TECHNIQUE: Multidetector CT imaging of the abdomen and pelvis was performed
using the standard protocol following bolus administration of
intravenous contrast.
CONTRAST:  100mL OMNIPAQUE IOHEXOL 300 MG/ML  SOLN

[Series 3: abdomen 5.0 · axial · 0.74mm/px · z∈[-581,-166]mm · 13 of 95 slices shown, 15 images]
[im 6/95  soft-tissue]
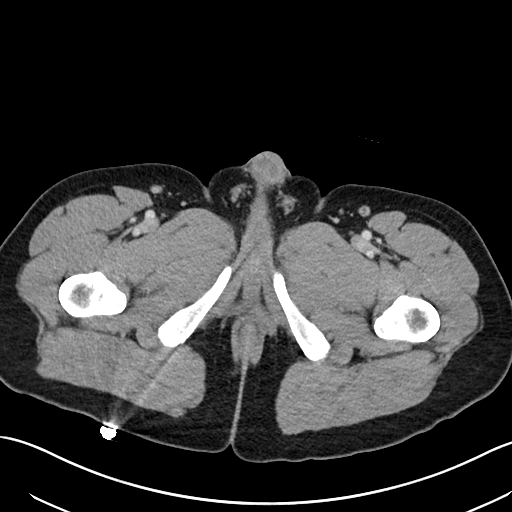
[im 6/95  bone]
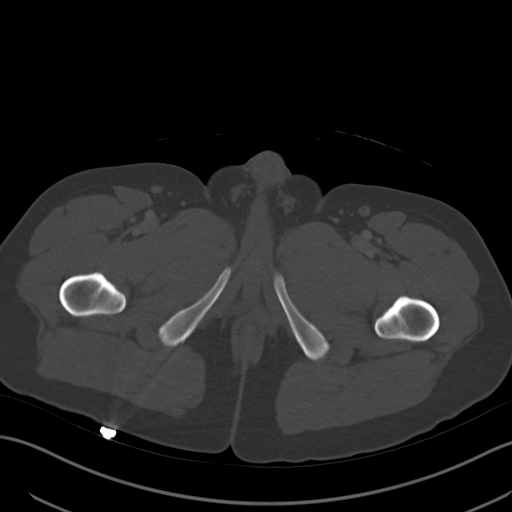
[im 11/95  soft-tissue]
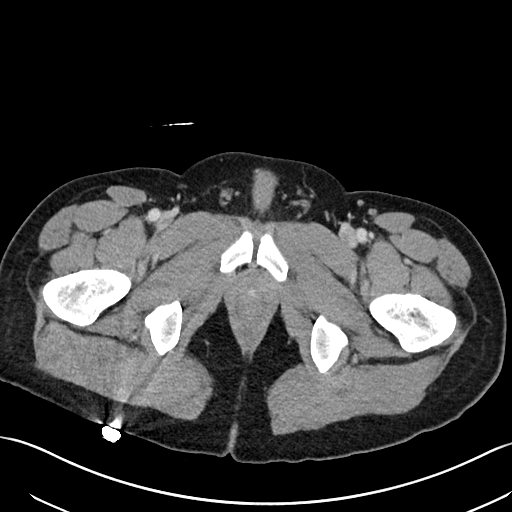
[im 21/95  soft-tissue]
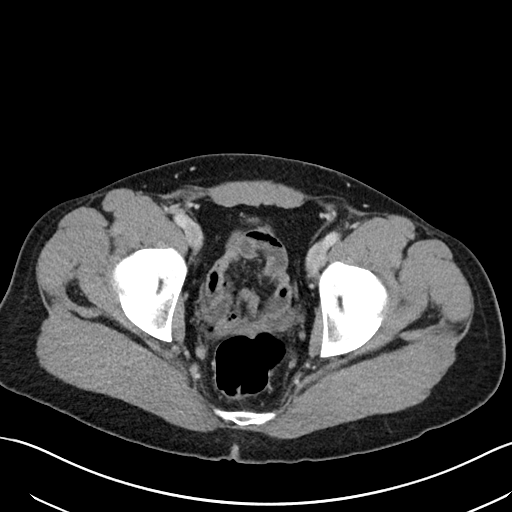
[im 27/95  soft-tissue]
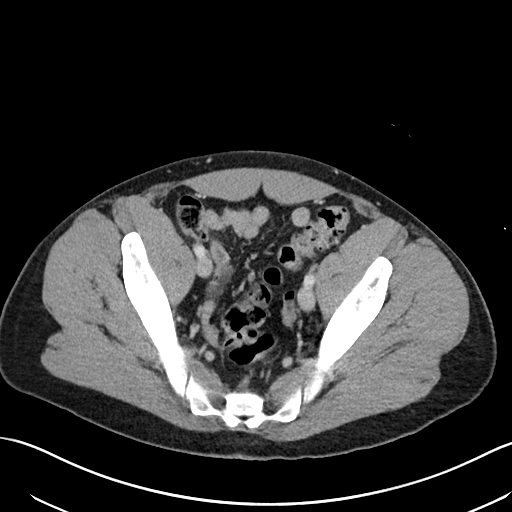
[im 32/95  soft-tissue]
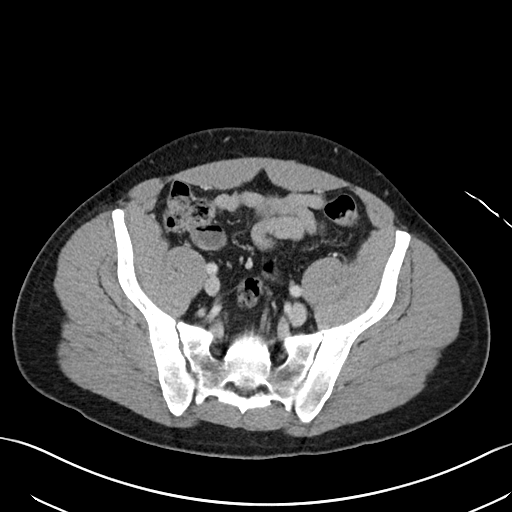
[im 42/95  soft-tissue]
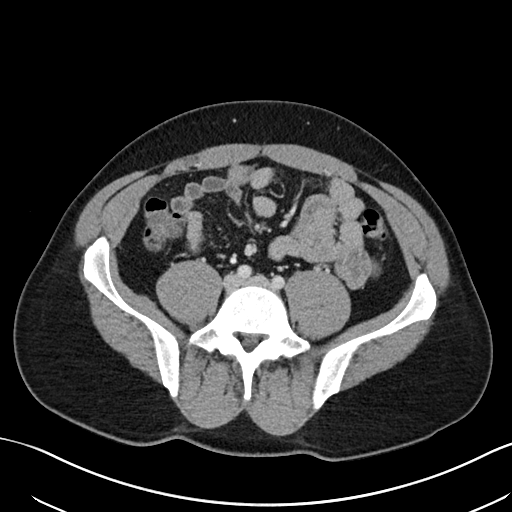
[im 48/95  soft-tissue]
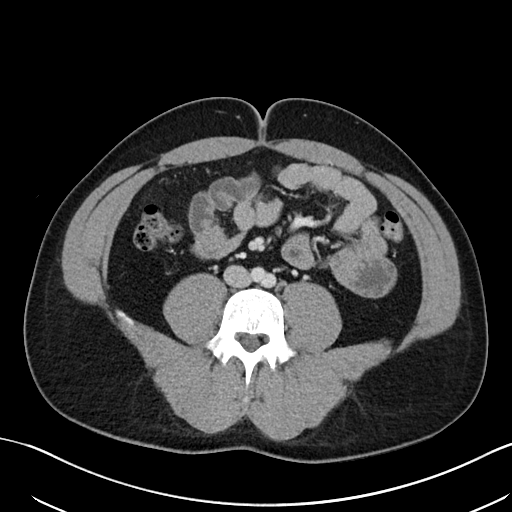
[im 53/95  soft-tissue]
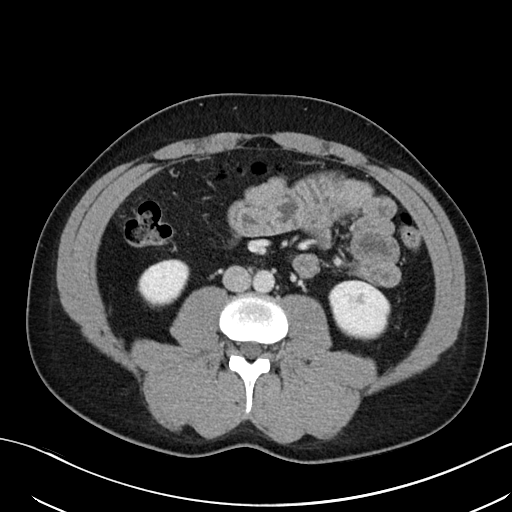
[im 63/95  soft-tissue]
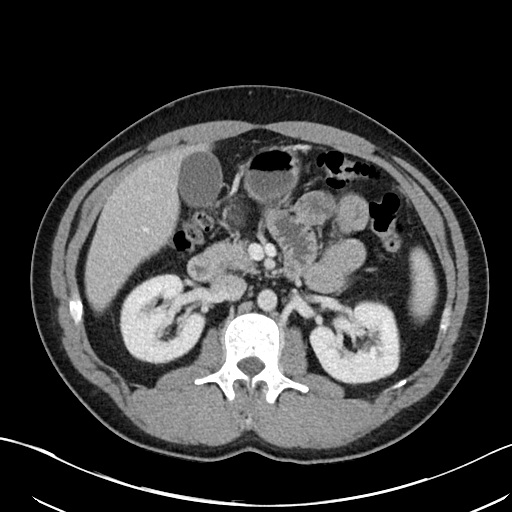
[im 63/95  bone]
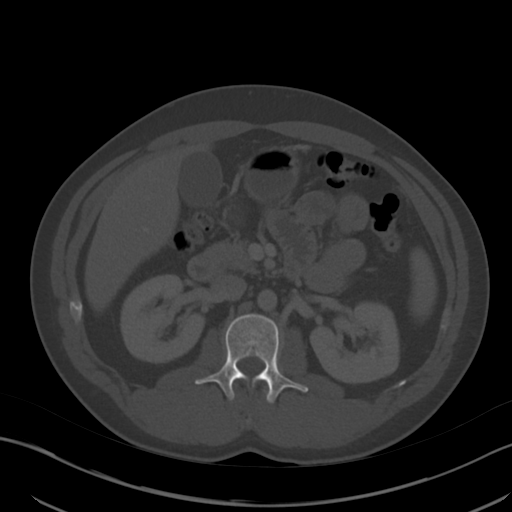
[im 68/95  soft-tissue]
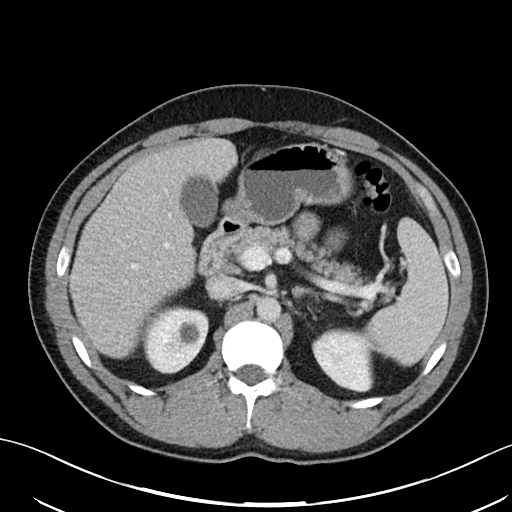
[im 74/95  soft-tissue]
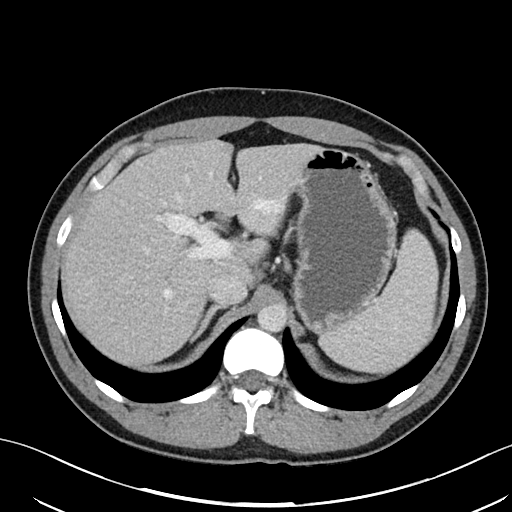
[im 84/95  soft-tissue]
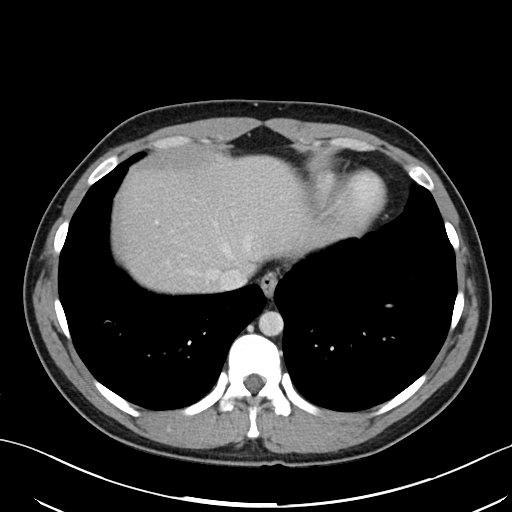
[im 89/95  soft-tissue]
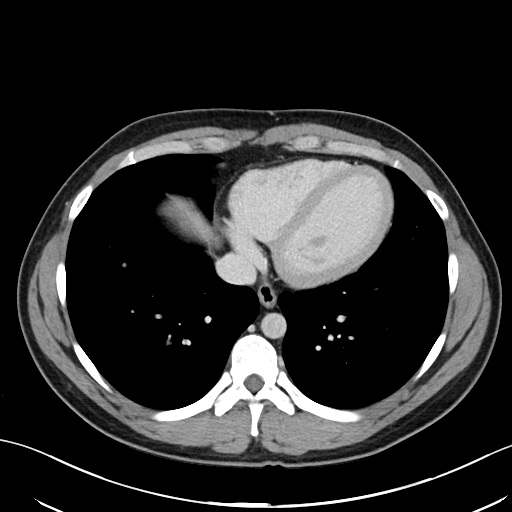

[Series 6: abdomen 3.0 mpr cor · coronal · 0.72mm/px · 3 of 96 slices shown]
[im 32/96  soft-tissue]
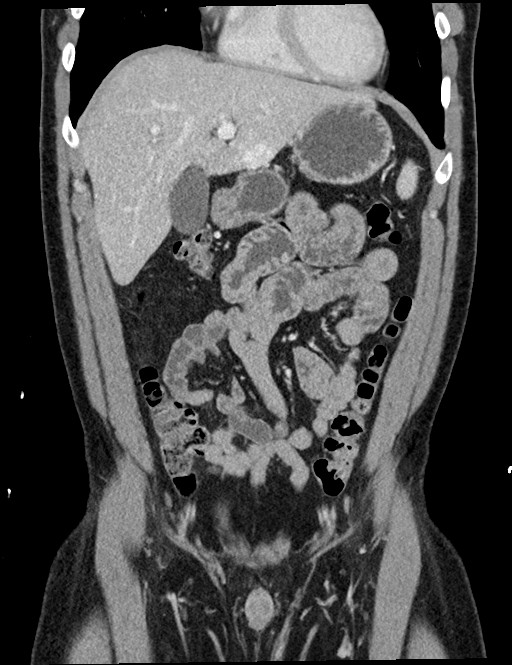
[im 43/96  soft-tissue]
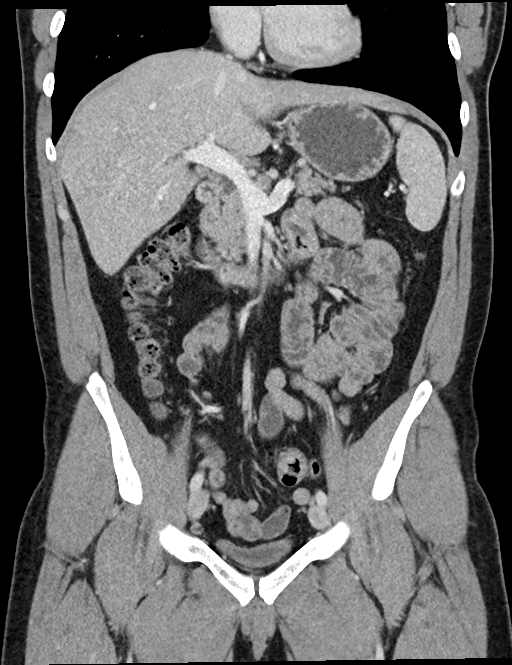
[im 53/96  soft-tissue]
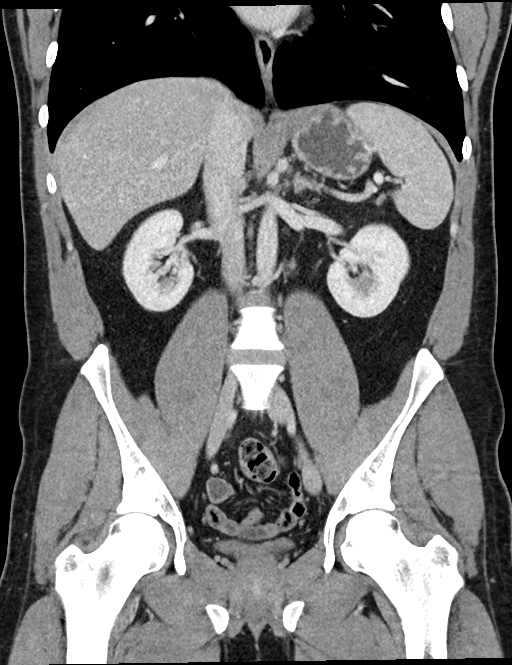

[16 of 46 positions shown; findings below may reference images not displayed]

FINDINGS: Lower chest: Mild nonspecific multifocal pulmonary ground-glass
opacity in the right lower lobe (series 5, images 21 through 32).
Negative visible middle lobes. But there is some mild left lower
lobe involvement (images 1 and 28). No pleural effusion. Cardiac
size at the upper limits of normal. No pericardial effusion.

Hepatobiliary: Negative liver and gallbladder. No bile duct
enlargement.

Pancreas: Negative.

Spleen: Negative.

Adrenals/Urinary Tract: Normal adrenal glands.

Small benign appearing right renal upper pole cyst, simple fluid
density. Normal kidneys with no hydronephrosis or perinephric
stranding. No nephrolithiasis identified. Decompressed and normal
ureters.

Decompressed and unremarkable urinary bladder.

Stomach/Bowel: The descending and rectosigmoid colon segments appear
normal. Negative transverse colon. Negative right colon. Normal
appendix identified on coronal image 50. Negative terminal ileum.

Mildly fluid-filled but nondilated small bowel. Negative stomach and
duodenum. No free air, free fluid.

Vascular/Lymphatic: Major arterial structures are patent and appear
normal. Portal venous system is patent.

Reproductive: Negative.

Other: No pelvic free fluid.

Musculoskeletal: Negative.
IMPRESSION: 1. Faint multifocal bilateral lower low lobe pulmonary ground-glass
opacity, which is nonspecific but can be seen with acute atypical
infection (as well as other non-infectious etiologies). In
particular, viral pneumonia (including [id number]) should be
considered in the appropriate clinical setting.

2. Normal appendix. And no acute or inflammatory process identified
in the abdomen or pelvis.
